# Patient Record
Sex: Male | Born: 1970
Health system: Southern US, Community
[De-identification: ages and names within clinical notes are randomized; demographics above are authoritative.]

## PROBLEM LIST (undated history)

## (undated) DIAGNOSIS — D649 Anemia, unspecified: Secondary | ICD-10-CM

## (undated) DIAGNOSIS — J45909 Unspecified asthma, uncomplicated: Secondary | ICD-10-CM

## (undated) DIAGNOSIS — K759 Inflammatory liver disease, unspecified: Secondary | ICD-10-CM

## (undated) DIAGNOSIS — A630 Anogenital (venereal) warts: Secondary | ICD-10-CM

## (undated) DIAGNOSIS — M1990 Unspecified osteoarthritis, unspecified site: Secondary | ICD-10-CM

## (undated) DIAGNOSIS — E669 Obesity, unspecified: Secondary | ICD-10-CM

## (undated) HISTORY — PX: HIP SURGERY: SHX245

## (undated) HISTORY — DX: Unspecified osteoarthritis, unspecified site: M19.90

## (undated) HISTORY — DX: Anogenital (venereal) warts: A63.0

## (undated) HISTORY — DX: Anemia, unspecified: D64.9

## (undated) HISTORY — DX: Unspecified asthma, uncomplicated: J45.909

---

## 2013-03-01 ENCOUNTER — Emergency Department: Payer: Self-pay | Admitting: Internal Medicine

## 2014-07-21 ENCOUNTER — Other Ambulatory Visit: Payer: Self-pay | Admitting: Family Medicine

## 2014-07-21 DIAGNOSIS — R768 Other specified abnormal immunological findings in serum: Secondary | ICD-10-CM

## 2014-07-30 ENCOUNTER — Ambulatory Visit: Payer: Self-pay

## 2014-08-04 ENCOUNTER — Ambulatory Visit: Payer: BLUE CROSS/BLUE SHIELD

## 2014-08-11 ENCOUNTER — Ambulatory Visit
Admission: RE | Admit: 2014-08-11 | Discharge: 2014-08-11 | Disposition: A | Discharge status: Home / Self Care | Payer: BLUE CROSS/BLUE SHIELD | Source: Ambulatory Visit | Attending: Family Medicine | Admitting: Family Medicine

## 2014-08-11 DIAGNOSIS — R768 Other specified abnormal immunological findings in serum: Secondary | ICD-10-CM

## 2014-08-11 DIAGNOSIS — B1920 Unspecified viral hepatitis C without hepatic coma: Secondary | ICD-10-CM | POA: Diagnosis not present

## 2014-08-11 DIAGNOSIS — K769 Liver disease, unspecified: Secondary | ICD-10-CM | POA: Insufficient documentation

## 2014-08-12 ENCOUNTER — Telehealth: Payer: Self-pay | Admitting: Family Medicine

## 2014-08-12 DIAGNOSIS — B1920 Unspecified viral hepatitis C without hepatic coma: Secondary | ICD-10-CM | POA: Insufficient documentation

## 2014-08-12 DIAGNOSIS — B182 Chronic viral hepatitis C: Secondary | ICD-10-CM

## 2014-08-12 DIAGNOSIS — K769 Liver disease, unspecified: Secondary | ICD-10-CM | POA: Insufficient documentation

## 2014-08-12 NOTE — Telephone Encounter (Signed)
I tried to contact this patient again but was unsuccessful.

## 2014-08-12 NOTE — Telephone Encounter (Signed)
i tried to contact this patient to discuss the information below from Dr. Nadine Counts, but I could not get through. I was not able to leave a message. I will try again later.

## 2014-08-12 NOTE — Telephone Encounter (Signed)
Please let [name] know that his RUQ ultrasound shows a left liver lobe mass that needs further MRI imaging to make sure it is not malignant in light of his chronic Hepatitis C infection. The radiologist says it may just be a collection of benign blood vessels but he can't be sure until we do an MRI. I have ordered the imaging and [facility] should be calling him to schedule the appointment. If he hasn't heard anything in 1-2 weeks please call our office so we can sort it out. Thank you.

## 2014-08-13 NOTE — Telephone Encounter (Signed)
Contacted this patient's emergency contact to see if she can get in contact with this patient for me since I have been unsuccessful in several attempts ans she said that she would.

## 2014-08-13 NOTE — Telephone Encounter (Signed)
I was finally able to get in contact with this patient to discuss the information from Dr. Nadine Counts. Patient was informed that he should get a call regarding his appointment but if he has not heard anything within 2 weeks to give [place] a call back. Patient agreed and said thanks.

## 2014-08-13 NOTE — Telephone Encounter (Signed)
[phone] it is okay for you to leave a detailed message

## 2014-08-18 ENCOUNTER — Encounter: Payer: Self-pay | Admitting: Family Medicine

## 2014-08-18 ENCOUNTER — Ambulatory Visit (INDEPENDENT_AMBULATORY_CARE_PROVIDER_SITE_OTHER): Payer: BLUE CROSS/BLUE SHIELD | Admitting: Family Medicine

## 2014-08-18 VITALS — BP 122/82 | HR 76 | Temp 98.0°F | Resp 18 | Ht 76.0 in | Wt 335.0 lb

## 2014-08-18 DIAGNOSIS — B182 Chronic viral hepatitis C: Secondary | ICD-10-CM | POA: Diagnosis not present

## 2014-08-18 DIAGNOSIS — R894 Abnormal immunological findings in specimens from other organs, systems and tissues: Secondary | ICD-10-CM | POA: Insufficient documentation

## 2014-08-18 DIAGNOSIS — M1710 Unilateral primary osteoarthritis, unspecified knee: Secondary | ICD-10-CM | POA: Insufficient documentation

## 2014-08-18 DIAGNOSIS — M179 Osteoarthritis of knee, unspecified: Secondary | ICD-10-CM | POA: Insufficient documentation

## 2014-08-18 DIAGNOSIS — Z0000 Encounter for general adult medical examination without abnormal findings: Secondary | ICD-10-CM | POA: Diagnosis not present

## 2014-08-18 DIAGNOSIS — Z13220 Encounter for screening for lipoid disorders: Secondary | ICD-10-CM | POA: Insufficient documentation

## 2014-08-18 DIAGNOSIS — B009 Herpesviral infection, unspecified: Secondary | ICD-10-CM | POA: Insufficient documentation

## 2014-08-18 DIAGNOSIS — Z8619 Personal history of other infectious and parasitic diseases: Secondary | ICD-10-CM | POA: Insufficient documentation

## 2014-08-18 DIAGNOSIS — R768 Other specified abnormal immunological findings in serum: Secondary | ICD-10-CM | POA: Insufficient documentation

## 2014-08-18 DIAGNOSIS — D649 Anemia, unspecified: Secondary | ICD-10-CM | POA: Insufficient documentation

## 2014-08-18 DIAGNOSIS — J45909 Unspecified asthma, uncomplicated: Secondary | ICD-10-CM | POA: Insufficient documentation

## 2014-08-18 DIAGNOSIS — E669 Obesity, unspecified: Secondary | ICD-10-CM | POA: Insufficient documentation

## 2014-08-18 LAB — POC HEMOCCULT BLD/STL (OFFICE/1-CARD/DIAGNOSTIC): Card #1 Date: NEGATIVE

## 2014-08-18 NOTE — Progress Notes (Signed)
Name: Seth Simmons   MRN: [id number]    DOB: 05/15/1970   Date:08/18/2014       Progress Note  Subjective  Chief Complaint  Chief Complaint  Patient presents with  . Annual Exam    patient is here for review of his results    HPI  Patient is here today for a Complete Male Physical Exam:  The patient has no acute concerns other than discussing ongoing work up of Hepatitis C. Overall feels healthy. Diet is well balanced. In general does not exercise regularly. Sees dentist regularly and addresses vision concerns with ophthalmologist if applicable. In regards to sexual activity the patient is currently sexually active. Currently is concerned about exposure to any STDs. Has had blood work done recently to address this.  Active Ambulatory Problems    Diagnosis Date Noted  . Hepatitis C virus infection without hepatic coma 08/12/2014  . Liver lesion, left lobe 08/12/2014  . Airway hyperreactivity 08/18/2014  . Absolute anemia 08/18/2014  . HCV antibody positive 08/18/2014  . Herpes simplex type 2 infection 08/18/2014  . H/O varicella 08/18/2014  . Extreme obesity 08/18/2014  . Adiposity 08/18/2014  . Arthritis of knee, degenerative 08/18/2014  . Encounter for screening for lipoid disorders 08/18/2014   Resolved Ambulatory Problems    Diagnosis Date Noted  . No Resolved Ambulatory Problems   Past Medical History  Diagnosis Date  . Anemia   . Arthritis   . Asthma      Past Surgical History  Procedure Laterality Date  . Hip surgery Left     Family History  Problem Relation Age of Onset  . Diabetes Mother   . Diabetes Father   . Diabetes Brother   . Cancer Brother   . Diabetes Brother     History   Social History  . Marital Status: Single    Spouse Name: N/A  . Number of Children: 2  . Years of Education: N/A   Occupational History  . 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 Patient [employer]   Social History Main  Topics  . Smoking status: Never Smoker   . Smokeless tobacco: Not on file  . Alcohol Use: 0.0 oz/week    0 Standard drinks or equivalent per week  . Drug Use: No  . Sexual Activity:    Partners: Female   Other Topics Concern  . Not on file   Social History Narrative  . No narrative on file     Current outpatient prescriptions:  .  diclofenac (VOLTAREN) 75 MG EC tablet, Take by mouth., Disp: , Rfl:   No Known Allergies  ROS  CONSTITUTIONAL: No significant weight changes, fever, chills, weakness or fatigue.  HEENT:  - Eyes: No visual changes.  - Ears: No auditory changes. No pain.  - Nose: No sneezing, congestion, runny nose. - Throat: No sore throat. No changes in swallowing. SKIN: No rash or itching.  CARDIOVASCULAR: No chest pain, chest pressure or chest discomfort. No palpitations or edema.  RESPIRATORY: No shortness of breath, cough or sputum.  GASTROINTESTINAL: No anorexia, nausea, vomiting. No changes in bowel habits. No abdominal pain or blood.  GENITOURINARY: No dysuria. No frequency. No discharge.  NEUROLOGICAL: No headache, dizziness, syncope, paralysis, ataxia, numbness or tingling in the extremities. No memory changes. No change in bowel or bladder control.  MUSCULOSKELETAL: No joint pain. No muscle pain. HEMATOLOGIC: No anemia, bleeding or bruising.  LYMPHATICS: No  enlarged lymph nodes.  PSYCHIATRIC: No change in mood. No change in sleep pattern.  ENDOCRINOLOGIC: No reports of sweating, cold or heat intolerance. No polyuria or polydipsia.   Objective  Filed Vitals:   08/18/14 0838  BP: 122/82  Pulse: 76  Temp: 98 F (36.7 C)  TempSrc: Oral  Resp: 18  Height: 6\' 4"  (1.93 m)  Weight: 335 lb (151.955 kg)  SpO2: 96%    Depression screen PHQ 2/9 08/18/2014  Decreased Interest 0  Down, Depressed, Hopeless 0  PHQ - 2 Score 0     Physical Exam  Constitutional: Patient is obese and well-nourished. In no distress.  HEENT:  - Head: Normocephalic and  atraumatic.  - Ears: Bilateral TMs gray, no erythema or effusion - Nose: Nasal mucosa moist - Mouth/Throat: Oropharynx is clear and moist. No tonsillar hypertrophy or erythema. No post nasal drainage.  - Eyes: Conjunctivae clear, EOM movements normal. PERRLA. No scleral icterus.  Neck: Normal range of motion. Neck supple. No JVD present. No thyromegaly present.  Cardiovascular: Normal rate, regular rhythm and normal heart sounds.  No murmur heard.  Pulmonary/Chest: Effort normal and breath sounds normal. No respiratory distress. Abdominal: Soft. Bowel sounds are normal, no distension. There is no tenderness. No hepatomegaly.  BREAST: Bilateral breast exam normal with no masses, skin changes or nipple discharge. Gynecomastia present. MALE GENITALIA: Bilateral testes descended with no masses, no penile lesions, no penile discharge. PROSTATE: Normal prostate size and consistency. RECTAL: no rectal masses or hemorrhoids Musculoskeletal: Normal range of motion bilateral UE and LE, no joint effusions. Has crepitus bilateral knees. Hallux valgus bilateral 1st toe MCP joints. Peripheral vascular: Bilateral LE no edema. Neurological: CN II-XII grossly intact with no focal deficits. Alert and oriented to person, place, and time. Coordination, balance, strength, speech and gait are normal.  Skin: Skin is warm and dry. No rash noted. No erythema.  Psychiatric: Patient has a normal mood and affect. Behavior is normal in office today. Judgment and thought content normal in office today.    Assessment & Plan 1. Annual physical exam   2. Chronic hepatitis C without hepatic coma MRI date secured.  - Ambulatory referral to Gastroenterology

## 2014-08-18 NOTE — Patient Instructions (Signed)
Hepatitis C  Hepatitis C is a viral infection of the liver. Infection may go undetected for months or years because symptoms may be absent or very mild. Chronic liver disease is the main danger of hepatitis C. This may lead to scarring of the liver (cirrhosis), liver failure, and liver cancer.  CAUSES   Hepatitis C is caused by the hepatitis C virus (HCV). Formerly, hepatitis C infections were most commonly transmitted through blood transfusions. In the early 1990s, routine testing of donated blood for hepatitis C and exclusion of blood that tests positive for HCV began. Now, HCV is most commonly transmitted from person to person through injection drug use, sharing needles, or sex with an infected person. A caregiver may also get the infection from exposure to the blood of an infected patient by way of a cut or needle stick.   SYMPTOMS   Acute Phase  Many cases of acute HCV infection are mild and cause few problems. Some people may not even realize they are sick. Symptoms in others may last a few weeks to several months and include:  · Feeling very tired.  · Loss of appetite.  · Nausea.  · Vomiting.  · Abdominal pain.  · Dark yellow urine.  · Yellow skin and eyes (jaundice).  · Itching of the skin.  Chronic Phase  · Between 50% to 85% of people who get HCV infection become "chronic carriers." They often have no symptoms, but the virus stays in their body. They may spread the virus to others and can get long-term liver disease.  · Many people with chronic HCV infection remain healthy for many years. However, up to 1 in 5 chronically infected people may develop severe liver diseases including scarring of the liver (cirrhosis), liver failure, or liver cancer.  DIAGNOSIS   Diagnosis of hepatitis C infection is made by testing blood for the presence of hepatitis C viral particles called RNA. Other tests may also be done to measure the status of current liver function, exclude other liver problems, or assess liver  damage.  TREATMENT   Treatment with many antiviral drugs is available and recommended for some patients with chronic HCV infection. Drug treatment is generally considered appropriate for patients who:  · Are 18 years of age or older.  · Have a positive test for HCV particles in the blood.  · Have a liver tissue sample (biopsy) that shows chronic hepatitis and significant scarring (fibrosis).  · Do not have signs of liver failure.  · Have acceptable blood test results that confirm the wellness of other body organs.  · Are willing to be treated and conform to treatment requirements.  · Have no other circumstances that would prevent treatment from being recommended (contraindications).  All people who are offered and choose to receive drug treatment must understand that careful medical follow up for many months and even years is crucial in order to make successful care possible. The goal of drug treatment is to eliminate any evidence of HCV in the blood on a long-term basis. This is called a "sustained virologic response" or SVR. Achieving a SVR is associated with a decrease in the chance of life-threatening liver problems, need for a liver transplant, liver cancer rates, and liver-related complications.  Successful treatment currently requires taking treatment drugs for at least 24 weeks and up to 72 weeks. An injected drug (interferon) given weekly and an oral antiviral medicine taken daily are usually prescribed. Side effects from these drugs are common and some may be very   serious. Your response to treatment must be carefully monitored by both you and your caregiver throughout the entire treatment period.  PREVENTION  There is no vaccine for hepatitis C. The only way to prevent the disease is to reduce the risk of exposure to the virus.   · Avoid sharing drug needles or personal items like toothbrushes, razors, and nail clippers with an infected person.  · Healthcare workers need to avoid injuries and wear  appropriate protective equipment such as gloves, gowns, and face masks when performing invasive medical or nursing procedures.  HOME CARE INSTRUCTIONS   To avoid making your liver disease worse:  · Strictly avoid drinking alcohol.  · Carefully review all new prescriptions of medicines with your caregiver. Ask your caregiver which drugs you should avoid. The following drugs are toxic to the liver, and your caregiver may tell you to avoid them:  ¨ Isoniazid.  ¨ Methyldopa.  ¨ Acetaminophen.  ¨ Anabolic steroids (muscle-building drugs).  ¨ Erythromycin.  ¨ Oral contraceptives (birth control pills).  · Check with your caregiver to make sure medicine you are currently taking will not be harmful.  · Periodic blood tests may be required. Follow your caregiver's advice about when you should have blood tests.  · Avoid a sexual relationship until advised otherwise by your caregiver.  · Avoid activities that could expose other people to your blood. Examples include sharing a toothbrush, nail clippers, razors, and needles.  · Bed rest is not necessary, but it may make you feel better. Recovery time is not related to the amount of rest you receive.  · This infection is contagious. Follow your caregiver's instructions in order to avoid spread of the infection.  SEEK IMMEDIATE MEDICAL CARE IF:  · You have increasing fatigue or weakness.  · You have an oral temperature above 102° F (38.9° C), not controlled by medicine.  · You develop loss of appetite, nausea, or vomiting.  · You develop jaundice.  · You develop easy bruising or bleeding.  · You develop any severe problems as a result of your treatment.  MAKE SURE YOU:   · Understand these instructions.  · Will watch your condition.  · Will get help right away if you are not doing well or get worse.  Document Released: 02/10/2000 Document Revised: 05/07/2011 Document Reviewed: 05/27/2013  ExitCare® Patient Information ©2015 [employer]. This information is not intended to replace  advice given to you by your health care provider. Make sure you discuss any questions you have with your health care provider.

## 2014-08-18 NOTE — Addendum Note (Signed)
Addended by: Bobetta Lime on: 08/18/2014 09:39 AM   Modules accepted: Orders, SmartSet

## 2014-08-20 ENCOUNTER — Other Ambulatory Visit: Payer: Self-pay | Admitting: Family Medicine

## 2014-08-20 ENCOUNTER — Telehealth: Payer: Self-pay

## 2014-08-20 DIAGNOSIS — B182 Chronic viral hepatitis C: Secondary | ICD-10-CM

## 2014-08-20 NOTE — Telephone Encounter (Signed)
Called patient, had to leave message, I need him to come by the office and pick up a lab slip and get a CMP drawn before his MRI so we can be sure the contrast won't harm his kidneys otherwise it'll have to the rescheduled.

## 2014-08-20 NOTE — Telephone Encounter (Signed)
Left a message for this patient to come by and pick up a lab slip or give a call with a fax number where we can fax this lab order so he can have his labs drawn prior to his imaging.

## 2014-08-21 ENCOUNTER — Ambulatory Visit
Admission: RE | Admit: 2014-08-21 | Discharge: 2014-08-21 | Disposition: A | Discharge status: Home / Self Care | Payer: BLUE CROSS/BLUE SHIELD | Source: Ambulatory Visit | Attending: Family Medicine | Admitting: Family Medicine

## 2014-08-21 ENCOUNTER — Other Ambulatory Visit
Admission: RE | Admit: 2014-08-21 | Discharge: 2014-08-21 | Disposition: A | Discharge status: Home / Self Care | Payer: BLUE CROSS/BLUE SHIELD | Source: Ambulatory Visit | Attending: Family Medicine | Admitting: Family Medicine

## 2014-08-21 DIAGNOSIS — B182 Chronic viral hepatitis C: Secondary | ICD-10-CM | POA: Diagnosis not present

## 2014-08-21 DIAGNOSIS — K7689 Other specified diseases of liver: Secondary | ICD-10-CM | POA: Diagnosis not present

## 2014-08-21 DIAGNOSIS — K769 Liver disease, unspecified: Secondary | ICD-10-CM

## 2014-08-21 LAB — COMPREHENSIVE METABOLIC PANEL
ALK PHOS: 65 U/L (ref 38–126)
ALT: 61 U/L (ref 17–63)
AST: 46 U/L — AB (ref 15–41)
Albumin: 4 g/dL (ref 3.5–5.0)
Anion gap: 11 (ref 5–15)
BUN: 15 mg/dL (ref 6–20)
CALCIUM: 8.5 mg/dL — AB (ref 8.9–10.3)
CHLORIDE: 107 mmol/L (ref 101–111)
CO2: 22 mmol/L (ref 22–32)
CREATININE: 0.76 mg/dL (ref 0.61–1.24)
GFR calc non Af Amer: >60 mL/min (ref >60)
Glucose, Bld: 96 mg/dL (ref 65–99)
POTASSIUM: 3.6 mmol/L (ref 3.5–5.1)
Sodium: 140 mmol/L (ref 135–145)
Total Bilirubin: 0.2 mg/dL — ABNORMAL LOW (ref 0.3–1.2)
Total Protein: 7.9 g/dL (ref 6.5–8.1)

## 2014-08-21 MED ORDER — GADOBENATE DIMEGLUMINE 529 MG/ML IV SOLN
20.0000 mL | Freq: Once | INTRAVENOUS | Status: AC | PRN
Start: 2014-08-21 — End: 2014-08-21
  Administered 2014-08-21: 20 mL via INTRAVENOUS

## 2014-09-14 ENCOUNTER — Telehealth: Payer: Self-pay | Admitting: Family Medicine

## 2014-09-14 NOTE — Telephone Encounter (Signed)
Patients girlfriend called about a referral to see someone for hepatits C. Pt was here in May or [place] and They have not heard anything.

## 2014-09-14 NOTE — Telephone Encounter (Signed)
Appointment is scheduled on 09/20/14 at 8:30am at [facility], patients girlfriend was notified, if needed they will call and reschedule if it conflicts with his schedule.

## 2014-09-14 NOTE — Telephone Encounter (Signed)
Shelly can you call over to Dr. Marton Redwood office and find out why they have not scheduled this patient with Hepatitis C??

## 2014-09-20 ENCOUNTER — Other Ambulatory Visit: Payer: Self-pay | Admitting: Student

## 2014-09-20 DIAGNOSIS — B182 Chronic viral hepatitis C: Secondary | ICD-10-CM

## 2014-09-21 ENCOUNTER — Other Ambulatory Visit: Payer: Self-pay | Admitting: Family Medicine

## 2014-09-21 ENCOUNTER — Telehealth: Payer: Self-pay | Admitting: Family Medicine

## 2014-09-21 DIAGNOSIS — L253 Unspecified contact dermatitis due to other chemical products: Secondary | ICD-10-CM

## 2014-09-21 MED ORDER — PREDNISONE 10 MG (21) PO TBPK: ORAL_TABLET | ORAL | Status: DC

## 2014-09-21 NOTE — Telephone Encounter (Signed)
Prednisone pack sent to [facility] [address].

## 2014-09-21 NOTE — Telephone Encounter (Signed)
Pt informed about meds being called in.

## 2014-09-21 NOTE — Telephone Encounter (Signed)
Patient is having an allergic reaction to the dye that he put in his beard. States this has happened before (last yr) and the ER prescribed him steroid pills that he had to take for a few days. He does not remember the name of the pill but says maybe [facility] would know.

## 2014-09-22 ENCOUNTER — Telehealth: Payer: Self-pay | Admitting: Family Medicine

## 2014-09-22 ENCOUNTER — Ambulatory Visit
Admission: RE | Admit: 2014-09-22 | Discharge: 2014-09-22 | Disposition: A | Discharge status: Home / Self Care | Payer: BLUE CROSS/BLUE SHIELD | Source: Ambulatory Visit | Attending: Student | Admitting: Student

## 2014-09-22 DIAGNOSIS — B182 Chronic viral hepatitis C: Secondary | ICD-10-CM | POA: Insufficient documentation

## 2014-09-22 NOTE — Telephone Encounter (Signed)
Tried to contact patient to let him know that a rx was submitted but that no work note was written due to him not being seen here in the office, but there was no answer.   A message was left for the patient stating this and to give [place] a call if he had any questions.

## 2014-09-22 NOTE — Telephone Encounter (Signed)
Face broke out due to allergic reaction. Was going to try to return to work on tomorrow however he is needing one for 09-21-14 and 09-22-14.

## 2014-09-22 NOTE — Telephone Encounter (Signed)
I sent in prednisone as a courtesy without seeing him for his rash but I can not provide work note excuse without documented office visit.

## 2014-09-23 ENCOUNTER — Ambulatory Visit: Payer: BLUE CROSS/BLUE SHIELD

## 2014-10-20 ENCOUNTER — Encounter: Payer: Self-pay | Admitting: *Deleted

## 2014-10-21 ENCOUNTER — Ambulatory Visit: Payer: BLUE CROSS/BLUE SHIELD | Admitting: Anesthesiology

## 2014-10-21 ENCOUNTER — Encounter: Payer: Self-pay | Admitting: Anesthesiology

## 2014-10-21 ENCOUNTER — Encounter
Admission: RE | Disposition: A | Discharge status: Home / Self Care | Payer: Self-pay | Source: Ambulatory Visit | Attending: Gastroenterology

## 2014-10-21 ENCOUNTER — Ambulatory Visit
Admission: RE | Admit: 2014-10-21 | Discharge: 2014-10-21 | Disposition: A | Discharge status: Home / Self Care | Payer: BLUE CROSS/BLUE SHIELD | Source: Ambulatory Visit | Attending: Gastroenterology | Admitting: Gastroenterology

## 2014-10-21 DIAGNOSIS — Z6839 Body mass index (BMI) 39.0-39.9, adult: Secondary | ICD-10-CM | POA: Insufficient documentation

## 2014-10-21 DIAGNOSIS — D649 Anemia, unspecified: Secondary | ICD-10-CM | POA: Insufficient documentation

## 2014-10-21 DIAGNOSIS — B1920 Unspecified viral hepatitis C without hepatic coma: Secondary | ICD-10-CM | POA: Insufficient documentation

## 2014-10-21 DIAGNOSIS — J45909 Unspecified asthma, uncomplicated: Secondary | ICD-10-CM | POA: Diagnosis not present

## 2014-10-21 DIAGNOSIS — K317 Polyp of stomach and duodenum: Secondary | ICD-10-CM | POA: Diagnosis not present

## 2014-10-21 DIAGNOSIS — K7460 Unspecified cirrhosis of liver: Secondary | ICD-10-CM | POA: Insufficient documentation

## 2014-10-21 DIAGNOSIS — M1990 Unspecified osteoarthritis, unspecified site: Secondary | ICD-10-CM | POA: Diagnosis not present

## 2014-10-21 HISTORY — DX: Obesity, unspecified: E66.9

## 2014-10-21 HISTORY — PX: ESOPHAGOGASTRODUODENOSCOPY (EGD) WITH PROPOFOL: SHX5813

## 2014-10-21 HISTORY — DX: Inflammatory liver disease, unspecified: K75.9

## 2014-10-21 SURGERY — ESOPHAGOGASTRODUODENOSCOPY (EGD) WITH PROPOFOL
Anesthesia: General

## 2014-10-21 MED ORDER — PROPOFOL INFUSION 10 MG/ML OPTIME
INTRAVENOUS | Status: DC | PRN
  Administered 2014-10-21: 300 ug/kg/min via INTRAVENOUS

## 2014-10-21 MED ORDER — PROPOFOL 10 MG/ML IV BOLUS
INTRAVENOUS | Status: DC | PRN
  Administered 2014-10-21: 80 mg via INTRAVENOUS
  Administered 2014-10-21: 20 mg via INTRAVENOUS
  Administered 2014-10-21: 50 mg via INTRAVENOUS
  Administered 2014-10-21: 80 mg via INTRAVENOUS

## 2014-10-21 MED ORDER — SODIUM CHLORIDE 0.9 % IV SOLN
INTRAVENOUS | Status: DC
  Administered 2014-10-21: 1000 mL via INTRAVENOUS

## 2014-10-21 MED ORDER — SODIUM CHLORIDE 0.9 % IV SOLN: INTRAVENOUS | Status: DC

## 2014-10-21 MED ORDER — GLYCOPYRROLATE 0.2 MG/ML IJ SOLN
INTRAMUSCULAR | Status: DC | PRN
  Administered 2014-10-21: 0.3 mg via INTRAVENOUS

## 2014-10-21 MED ORDER — MIDAZOLAM HCL 2 MG/2ML IJ SOLN
INTRAMUSCULAR | Status: DC | PRN
  Administered 2014-10-21 (×2): 2 mg via INTRAVENOUS

## 2014-10-21 MED ORDER — LIDOCAINE HCL (CARDIAC) 20 MG/ML IV SOLN
INTRAVENOUS | Status: DC | PRN
  Administered 2014-10-21: 100 mg via INTRAVENOUS

## 2014-10-21 MED ORDER — FENTANYL CITRATE (PF) 100 MCG/2ML IJ SOLN
INTRAMUSCULAR | Status: DC | PRN
  Administered 2014-10-21 (×2): 50 ug via INTRAVENOUS

## 2014-10-21 NOTE — Op Note (Signed)
[facility]astroenterology Patient Name: Seth Simmons Procedure Date: 10/21/2014 10:39 AM MRN: [id number] Account #: [id number] Date of Birth: 12-06-1970 Admit Type: Outpatient Age: 44 Room: [facility] ENDO ROOM 1 Gender: Male Note Status: Finalized Procedure:         Upper GI endoscopy Indications:       Cirrhosis rule out esophageal varices Patient Profile:   This is a 44 year old male. Providers:         Gerrit Heck. Rayann Heman, MD Referring MD:      Bobetta Lime (Referring MD) Medicines:         Propofol per Anesthesia Complications:     No immediate complications. Procedure:         Pre-Anesthesia Assessment:                    - Prior to the procedure, a History and Physical was                     performed, and patient medications, allergies and                     sensitivities were reviewed. The patient's tolerance of                     previous anesthesia was reviewed.                    After obtaining informed consent, the endoscope was passed                     under direct vision. Throughout the procedure, the                     patient's blood pressure, pulse, and oxygen saturations                     were monitored continuously. The Endoscope was introduced                     through the mouth, and advanced to the second part of                     duodenum. The upper GI endoscopy was accomplished without                     difficulty. The patient tolerated the procedure well. Findings:      The esophagus was normal. No esophageal varices.      A single 10 mm sessile polyp with no bleeding was found in the gastric       antrum. The polyp was removed with a saline injection-lift technique       using a hot snare. Resection and retrieval were complete. Two hemostatic       clips were successfully placed. This was done to prevent bleeding.       Estimated blood loss: none.      The examined duodenum was normal.      The exam was otherwise without  abnormality. Impression:        - Normal esophagus.                    - A single gastric polyp. Resected and retrieved. Clips                     were  placed.                    - Normal examined duodenum.                    - The examination was otherwise normal. Recommendation:    - Observe patient in GI recovery unit.                    - Resume regular diet.                    - Continue present medications.                    - Await pathology results.                    - Return to GI clinic.                    - The findings and recommendations were discussed with the                     patient.                    - The findings and recommendations were discussed with the                     patient's family. Procedure Code(s): --- Professional ---                    [phone], Esophagogastroduodenoscopy, flexible, transoral;                     with removal of tumor(s), polyp(s), or other lesion(s) by                     snare technique                    [id number], Esophagogastroduodenoscopy, flexible, transoral;                     with directed submucosal injection(s), any substance CPT copyright 2014 American Medical Association. All rights reserved. The codes documented in this report are preliminary and upon coder review may  be revised to meet current compliance requirements. Mellody Life, MD 10/21/2014 11:22:59 AM This report has been signed electronically. Number of Addenda: 0 Note Initiated On: 10/21/2014 10:39 AM      [facility]

## 2014-10-21 NOTE — Anesthesia Preprocedure Evaluation (Addendum)
Anesthesia Evaluation  Patient identified by MRN, date of birth, ID band Patient awake    Reviewed: Allergy & Precautions, NPO status , Patient's Chart, lab work & pertinent test results, reviewed documented beta blocker date and time   Airway Mallampati: II  TM Distance: >3 FB     Dental  (+) Chipped   Pulmonary asthma ,          Cardiovascular     Neuro/Psych    GI/Hepatic (+) Hepatitis -, C  Endo/Other  Morbid obesity  Renal/GU      Musculoskeletal  (+) Arthritis -,   Abdominal   Peds  Hematology  (+) anemia ,   Anesthesia Other Findings Obesity.  Reproductive/Obstetrics                            Anesthesia Physical Anesthesia Plan  ASA: III  Anesthesia Plan: General   Post-op Pain Management:    Induction:   Airway Management Planned: Nasal Cannula  Additional Equipment:   Intra-op Plan:   Post-operative Plan:   Informed Consent: I have reviewed the patients History and Physical, chart, labs and discussed the procedure including the risks, benefits and alternatives for the proposed anesthesia with the patient or authorized representative who has indicated his/her understanding and acceptance.     Plan Discussed with: CRNA  Anesthesia Plan Comments:         Anesthesia Quick Evaluation

## 2014-10-21 NOTE — Discharge Instructions (Signed)

## 2014-10-21 NOTE — Transfer of Care (Signed)
Immediate Anesthesia Transfer of Care Note  Patient: Seth Simmons  Procedure(s) Performed: Procedure(s): ESOPHAGOGASTRODUODENOSCOPY (EGD) WITH PROPOFOL (N/A)  Patient Location: PACU  Anesthesia Type:General  Level of Consciousness: awake, alert  and oriented  Airway & Oxygen Therapy: Patient Spontanous Breathing  Post-op Assessment: Report given to RN and Post -op Vital signs reviewed and stable  Post vital signs: Reviewed and stable  Last Vitals:  Filed Vitals:   10/21/14 1126  BP: 139/83  Pulse: 108  Temp: 35.7 C  Resp: 15    Complications: No apparent anesthesia complications

## 2014-10-21 NOTE — H&P (Signed)
  Primary Care Physician:  Bobetta Lime, MD  Pre-Procedure History & Physical: HPI:  Seth Simmons is a 44 y.o. male is here for an endoscopy.   Past Medical History  Diagnosis Date  . Anemia   . Arthritis   . Asthma   . Hepatitis   . Obesity     Past Surgical History  Procedure Laterality Date  . Hip surgery Left     Prior to Admission medications   Medication Sig Start Date End Date Taking? Authorizing Provider  diclofenac (VOLTAREN) 75 MG EC tablet Take by mouth.    Historical Provider, MD  predniSONE (STERAPRED UNI-PAK 21 TAB) 10 MG (21) TBPK tablet Use as directed in a 6 day taper PredPak Patient not taking: Reported on 10/20/2014 09/21/14   Bobetta Lime, MD  valACYclovir (VALTREX) 500 MG tablet Take 500 mg by mouth 2 (two) times daily.    Historical Provider, MD    Allergies as of 09/30/2014  . (No Known Allergies)    Family History  Problem Relation Age of Onset  . Diabetes Mother   . Diabetes Father   . Diabetes Brother   . Cancer Brother   . Diabetes Brother     Social History   Social History  . Marital Status: Single    Spouse Name: N/A  . Number of Children: 2  . Years of Education: N/A   Occupational History  . 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 Patient [employer]   Social History Main Topics  . Smoking status: Never Smoker   . Smokeless tobacco: Not on file  . Alcohol Use: 0.0 oz/week    0 Standard drinks or equivalent per week  . Drug Use: No  . Sexual Activity:    Partners: Female   Other Topics Concern  . Not on file   Social History Narrative     Physical Exam: BP 129/87 mmHg  Pulse 75  Temp(Src) 98.1 F (36.7 C) (Tympanic)  Resp 16  Ht 6\' 4"  (1.93 m)  Wt 148.326 kg (327 lb)  BMI 39.82 kg/m2  SpO2 99% General:   Alert,  pleasant and cooperative in NAD Head:  Normocephalic and atraumatic. Neck:  Supple; no masses or thyromegaly. Lungs:  Clear throughout to auscultation.     Heart:  Regular rate and rhythm. Abdomen:  Soft, nontender and nondistended. Normal bowel sounds, without guarding, and without rebound.  + abd adiposity Neurologic:  Alert and  oriented x4;  grossly normal neurologically.  Impression/Plan: Seth Simmons is here for an endoscopy to be performed for cirrhosis, r/o varices  Risks, benefits, limitations, and alternatives regarding  endoscopy have been reviewed with the patient.  Questions have been answered.  All parties agreeable.   Josefine Class, MD  10/21/2014, 10:47 AM

## 2014-10-22 LAB — SURGICAL PATHOLOGY

## 2014-10-22 NOTE — Anesthesia Postprocedure Evaluation (Signed)
  Anesthesia Post-op Note  Patient: Seth Simmons  Procedure(s) Performed: Procedure(s): ESOPHAGOGASTRODUODENOSCOPY (EGD) WITH PROPOFOL (N/A)  Anesthesia type:General  Patient location: PACU  Post pain: Pain level controlled  Post assessment: Post-op Vital signs reviewed, Patient's Cardiovascular Status Stable, Respiratory Function Stable, Patent Airway and No signs of Nausea or vomiting  Post vital signs: Reviewed and stable  Last Vitals:  Filed Vitals:   10/21/14 1158  BP: 113/65  Pulse: 68  Temp:   Resp: 12    Level of consciousness: awake, alert  and patient cooperative  Complications: No apparent anesthesia complications

## 2015-03-02 ENCOUNTER — Ambulatory Visit: Payer: BLUE CROSS/BLUE SHIELD | Admitting: Family Medicine

## 2015-03-16 ENCOUNTER — Ambulatory Visit: Payer: BLUE CROSS/BLUE SHIELD | Admitting: Family Medicine

## 2015-03-23 ENCOUNTER — Ambulatory Visit: Payer: BLUE CROSS/BLUE SHIELD | Admitting: Family Medicine

## 2015-03-30 ENCOUNTER — Ambulatory Visit: Payer: BLUE CROSS/BLUE SHIELD | Admitting: Family Medicine

## 2015-04-13 ENCOUNTER — Ambulatory Visit: Payer: BLUE CROSS/BLUE SHIELD | Admitting: Family Medicine

## 2015-05-25 ENCOUNTER — Other Ambulatory Visit: Payer: Self-pay | Admitting: Gastroenterology

## 2015-05-25 DIAGNOSIS — B182 Chronic viral hepatitis C: Secondary | ICD-10-CM

## 2015-05-27 ENCOUNTER — Ambulatory Visit: Payer: BLUE CROSS/BLUE SHIELD

## 2015-06-01 ENCOUNTER — Ambulatory Visit
Admission: RE | Admit: 2015-06-01 | Discharge: 2015-06-01 | Disposition: A | Discharge status: Home / Self Care | Payer: BLUE CROSS/BLUE SHIELD | Source: Ambulatory Visit | Attending: Gastroenterology | Admitting: Gastroenterology

## 2015-06-01 DIAGNOSIS — B182 Chronic viral hepatitis C: Secondary | ICD-10-CM

## 2015-06-01 DIAGNOSIS — D1803 Hemangioma of intra-abdominal structures: Secondary | ICD-10-CM | POA: Diagnosis not present

## 2016-02-17 DIAGNOSIS — B182 Chronic viral hepatitis C: Secondary | ICD-10-CM | POA: Diagnosis not present

## 2017-03-20 ENCOUNTER — Ambulatory Visit: Payer: BLUE CROSS/BLUE SHIELD | Admitting: Internal Medicine

## 2017-05-06 IMAGING — US US ABDOMEN LIMITED
1 series · 14 of 25 positions shown · non-contrast
Comparison: None.

CLINICAL DATA: Hepatitis-C

EXAM:
US ABDOMEN LIMITED - RIGHT UPPER QUADRANT

[Series 1: us abdomen limited · 0.35mm/px · 14 of 59 slices shown]
[im 1/59]
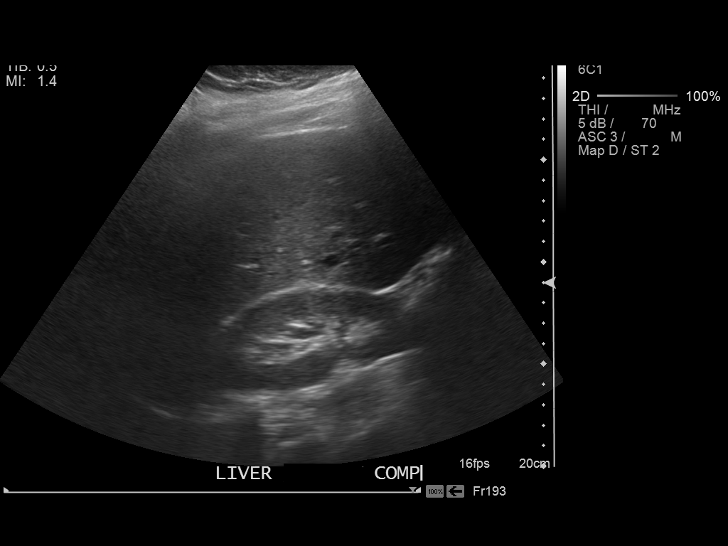
[im 5/59]
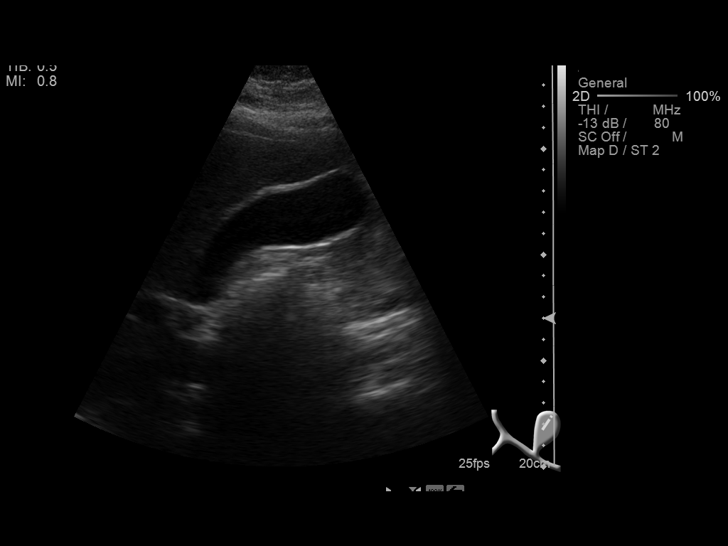
[im 10/59]
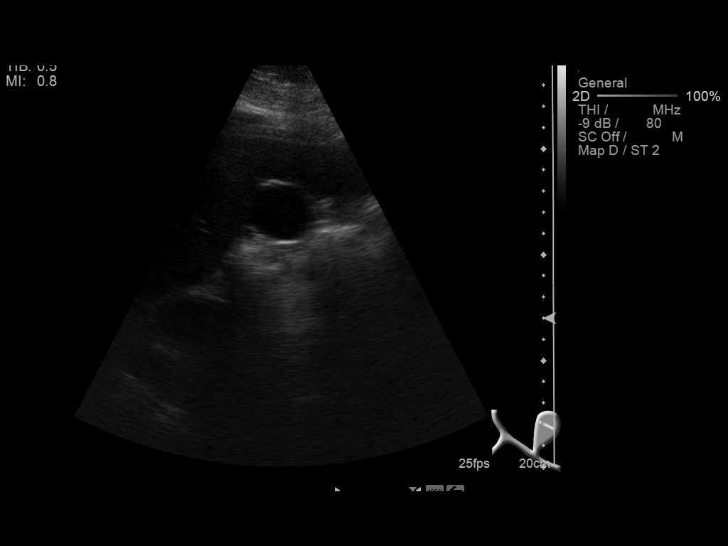
[im 15/59]
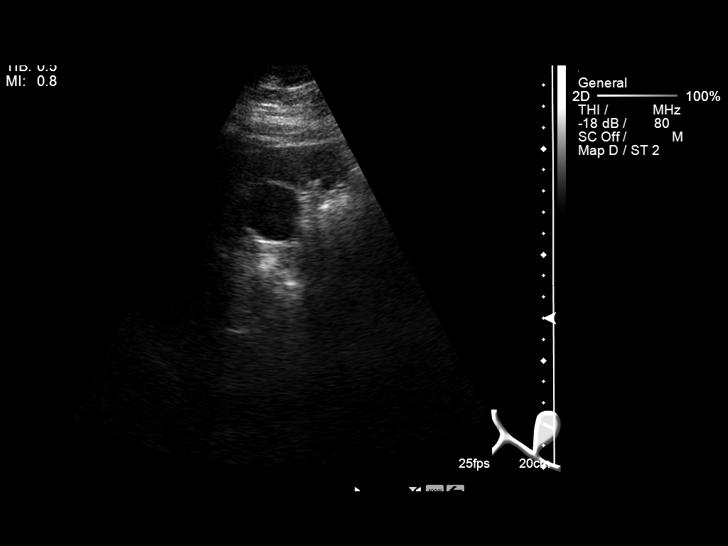
[im 20/59]
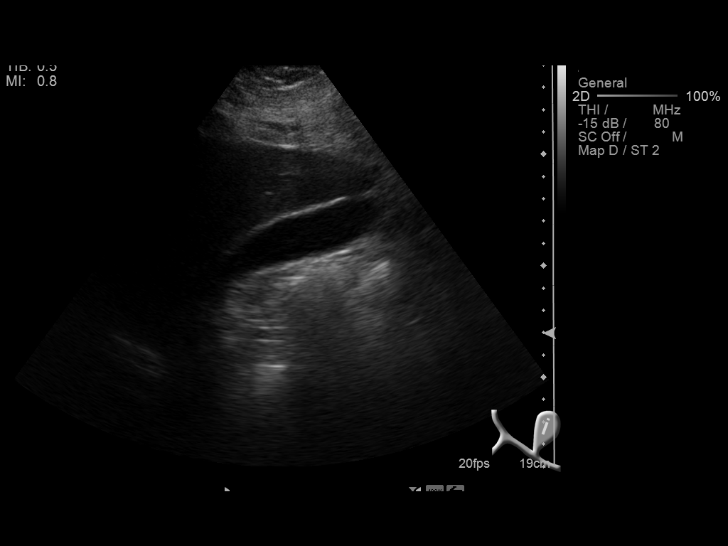
[im 22/59]
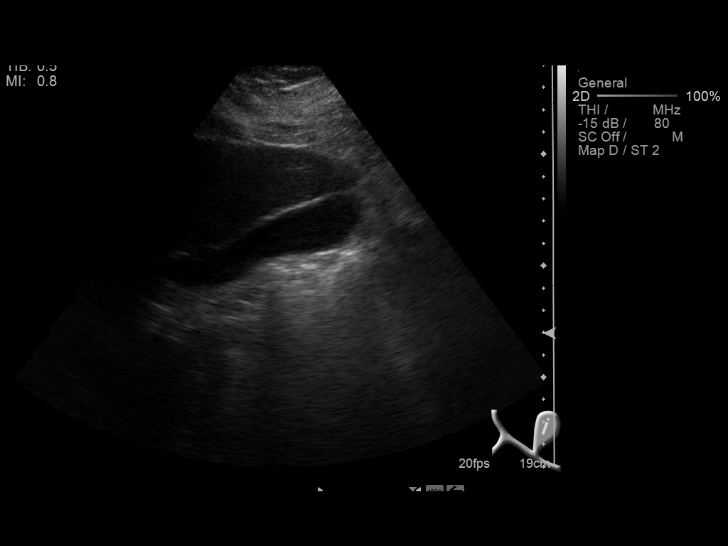
[im 27/59]
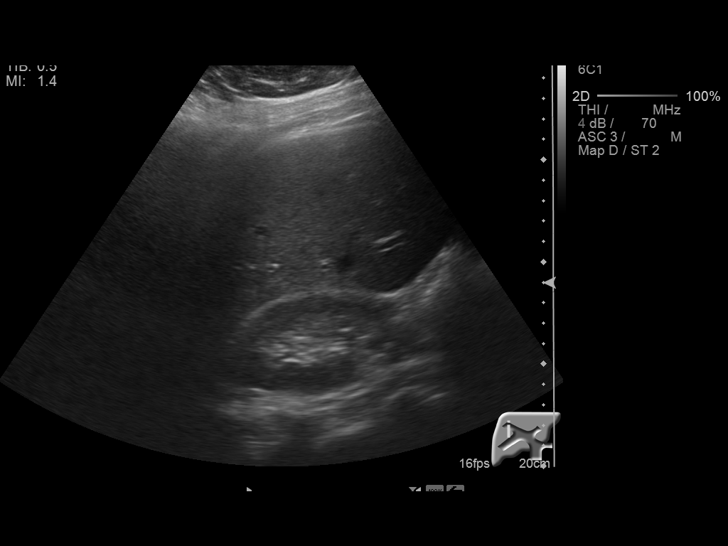
[im 32/59]
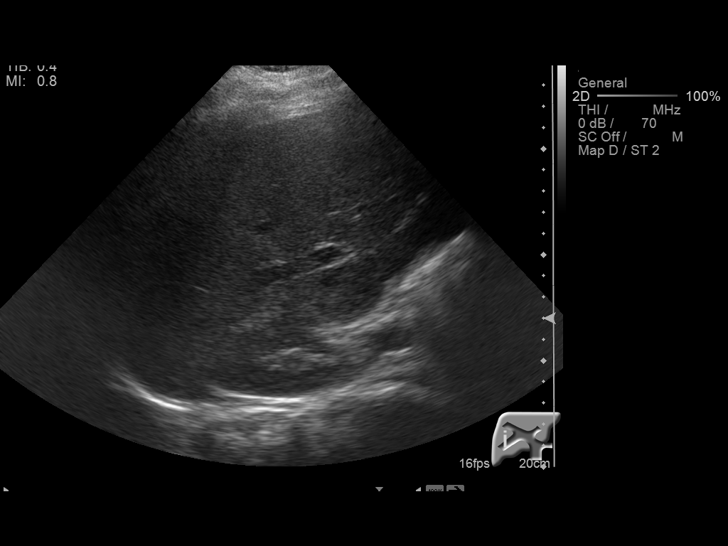
[im 37/59]
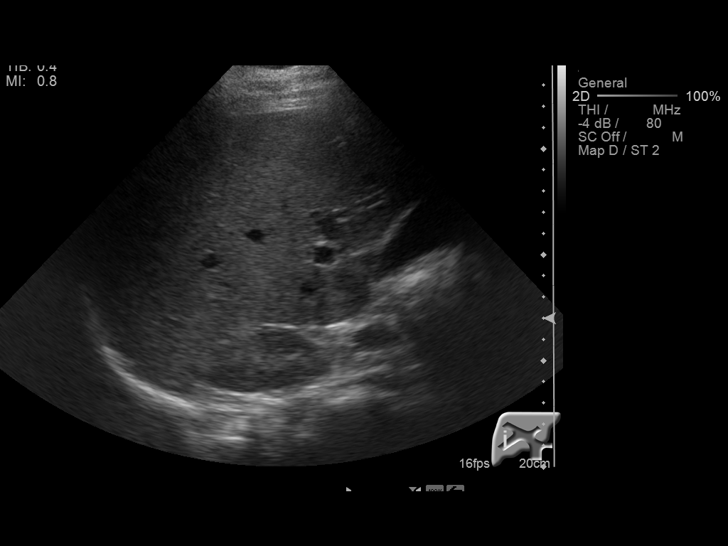
[im 39/59]
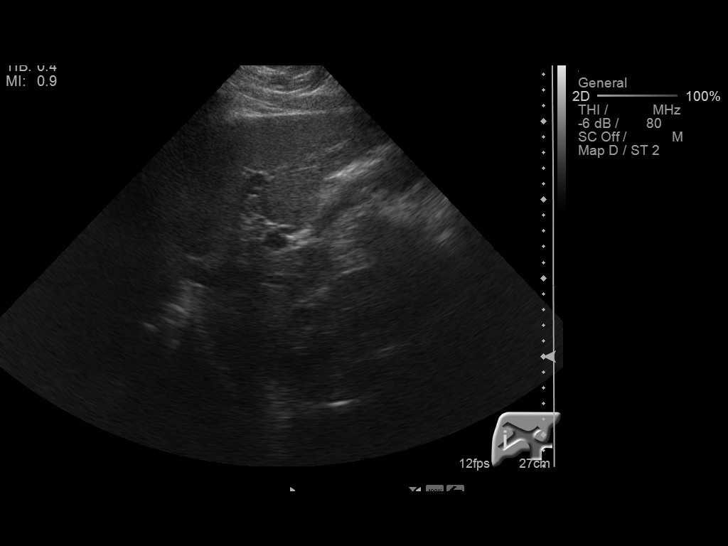
[im 44/59]
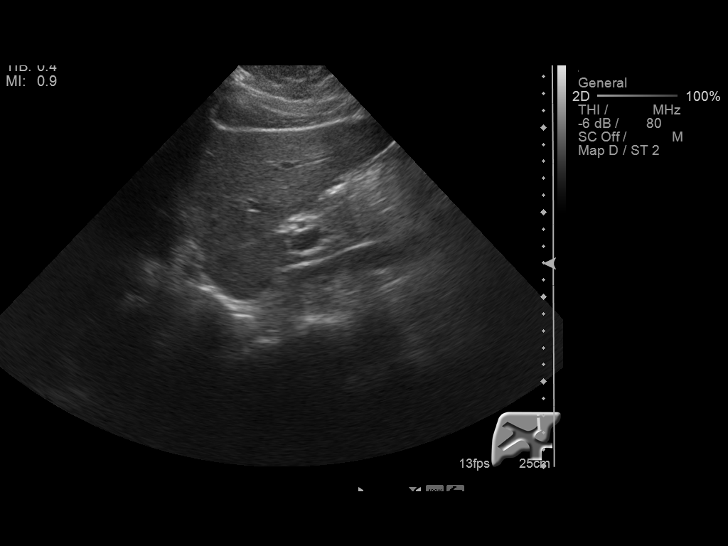
[im 49/59]
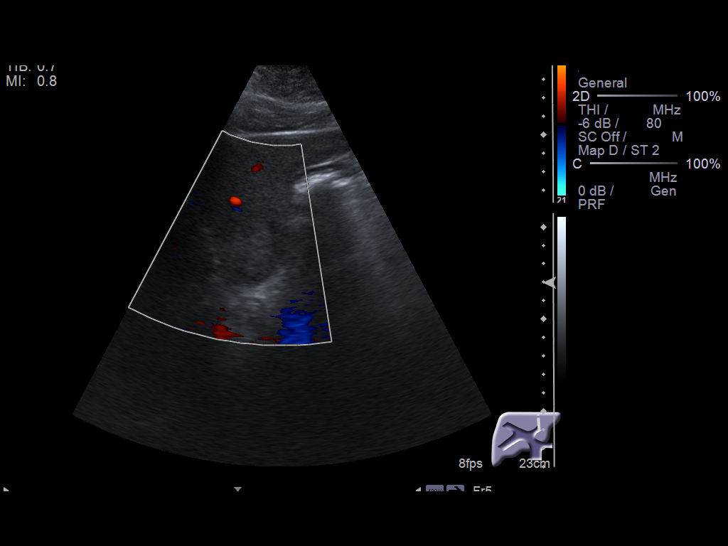
[im 54/59]
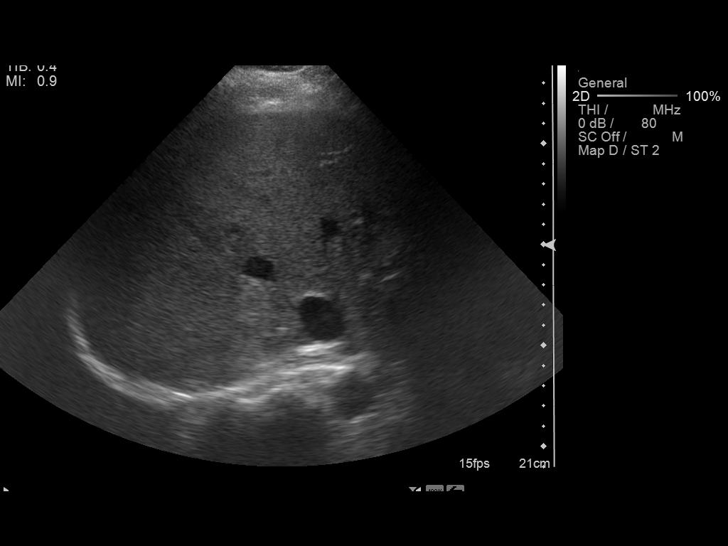
[im 59/59]
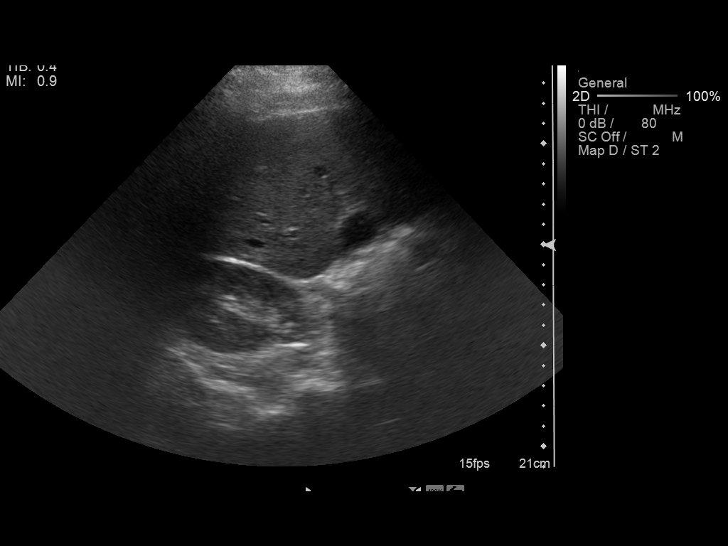

[14 of 25 positions shown; findings below may reference images not displayed]

FINDINGS: Gallbladder:

The gallbladder is well seen and no gallstones are noted. There is
no pain over the gallbladder with compression.

Common bile duct:

Diameter: The common bile duct is normal measuring 4.0 mm.

Liver:

The liver has a relatively normal echogenic pattern. However, within
the posterior left lobe there is a focal echogenic mass of 3.3 x
x 3.0 cm. Although this may represent a hemangioma, in view of the
patient's history, hepatocellular carcinoma would be difficult to
exclude and MR imaging is recommended.
IMPRESSION: 1. 3.3 x 2.9 x 3.0 cm echogenic lesion the left lobe of liver.
Cannot exclude hepatocellular carcinoma although this may represent
a hepatic hemangioma. Recommend MRI of the liver to assess further
in view of the patient's history.
2. No gallstones.

## 2017-06-24 DIAGNOSIS — R0981 Nasal congestion: Secondary | ICD-10-CM | POA: Diagnosis not present

## 2017-06-24 DIAGNOSIS — J40 Bronchitis, not specified as acute or chronic: Secondary | ICD-10-CM | POA: Diagnosis not present

## 2017-10-11 ENCOUNTER — Encounter: Payer: BLUE CROSS/BLUE SHIELD | Admitting: Family

## 2017-10-11 ENCOUNTER — Ambulatory Visit: Payer: BLUE CROSS/BLUE SHIELD | Admitting: Family

## 2017-10-11 ENCOUNTER — Encounter: Payer: Self-pay | Admitting: Family

## 2017-10-11 VITALS — BP 110/70 | HR 70 | Temp 97.8°F | Resp 16 | Ht 75.0 in | Wt 351.5 lb

## 2017-10-11 DIAGNOSIS — B182 Chronic viral hepatitis C: Secondary | ICD-10-CM

## 2017-10-11 DIAGNOSIS — R002 Palpitations: Secondary | ICD-10-CM | POA: Insufficient documentation

## 2017-10-11 DIAGNOSIS — E669 Obesity, unspecified: Secondary | ICD-10-CM

## 2017-10-11 DIAGNOSIS — Z125 Encounter for screening for malignant neoplasm of prostate: Secondary | ICD-10-CM | POA: Diagnosis not present

## 2017-10-11 DIAGNOSIS — Z0000 Encounter for general adult medical examination without abnormal findings: Secondary | ICD-10-CM | POA: Insufficient documentation

## 2017-10-11 LAB — LIPID PANEL
CHOL/HDL RATIO: 3
Cholesterol: 186 mg/dL (ref 0–200)
HDL: 56 mg/dL (ref >39.00)
LDL CALC: 111 mg/dL — AB (ref 0–99)
NonHDL: 130.16
TRIGLYCERIDES: 96 mg/dL (ref 0.0–149.0)
VLDL: 19.2 mg/dL (ref 0.0–40.0)

## 2017-10-11 LAB — CBC WITH DIFFERENTIAL/PLATELET
BASOS ABS: 0.1 10*3/uL (ref 0.0–0.1)
Basophils Relative: 1.2 % (ref 0.0–3.0)
EOS ABS: 0.1 10*3/uL (ref 0.0–0.7)
Eosinophils Relative: 2.2 % (ref 0.0–5.0)
HCT: 42 % (ref 39.0–52.0)
Hemoglobin: 13.7 g/dL (ref 13.0–17.0)
LYMPHS ABS: 1.6 10*3/uL (ref 0.7–4.0)
Lymphocytes Relative: 36.7 % (ref 12.0–46.0)
MCHC: 32.6 g/dL (ref 30.0–36.0)
MCV: 85 fl (ref 78.0–100.0)
MONO ABS: 0.4 10*3/uL (ref 0.1–1.0)
Monocytes Relative: 9.5 % (ref 3.0–12.0)
NEUTROS ABS: 2.2 10*3/uL (ref 1.4–7.7)
NEUTROS PCT: 50.4 % (ref 43.0–77.0)
PLATELETS: 174 10*3/uL (ref 150.0–400.0)
RBC: 4.95 Mil/uL (ref 4.22–5.81)
RDW: 14.1 % (ref 11.5–15.5)
WBC: 4.4 10*3/uL (ref 4.0–10.5)

## 2017-10-11 LAB — COMPREHENSIVE METABOLIC PANEL
ALT: 12 U/L (ref 0–53)
AST: 15 U/L (ref 0–37)
Albumin: 4.1 g/dL (ref 3.5–5.2)
Alkaline Phosphatase: 68 U/L (ref 39–117)
BILIRUBIN TOTAL: 0.5 mg/dL (ref 0.2–1.2)
BUN: 17 mg/dL (ref 6–23)
CHLORIDE: 106 meq/L (ref 96–112)
CO2: 30 meq/L (ref 19–32)
Calcium: 9.2 mg/dL (ref 8.4–10.5)
Creatinine, Ser: 0.84 mg/dL (ref 0.40–1.50)
GFR: 126.51 mL/min (ref >60.00)
GLUCOSE: 99 mg/dL (ref 70–99)
Potassium: 4.1 mEq/L (ref 3.5–5.1)
Sodium: 142 mEq/L (ref 135–145)
Total Protein: 7.5 g/dL (ref 6.0–8.3)

## 2017-10-11 LAB — HEMOGLOBIN A1C: Hgb A1c MFr Bld: 5.8 % (ref 4.6–6.5)

## 2017-10-11 LAB — TSH: TSH: 1.34 u[IU]/mL (ref 0.35–4.50)

## 2017-10-11 LAB — VITAMIN D 25 HYDROXY (VIT D DEFICIENCY, FRACTURES): VITD: 16.22 ng/mL — AB (ref 30.00–100.00)

## 2017-10-11 NOTE — Assessment & Plan Note (Signed)
Lost to follow-up.  Pending referral to GI to reestablish care.  Pending hepatitis C labs.

## 2017-10-11 NOTE — Progress Notes (Signed)
Subjective:    Patient ID: Jordy Verba, male    DOB: [birth year], 47 y.o.   MRN: [id number]  CC: Cristiano Capri is a 47 y.o. male who presents today to establish care.   HPI: H/o of chronic hepatitis c- states cured with Harvoni. Hasnt seen a GI provider.  No n, v, abdominal pain.   Concerned with weight gain. Works two jobs and no time for exercise.   Describes for 'years' get a 'heart race.' No CP. Goes away in a couple minutes. Every episode occurs at work, when moving at work, 1-2 per month. Little dizzy when it occurs. Notes when has grabbed on to side of truck, has felt dizzy. Also if he turns his head to quickly, will feel dizzy.  No syncope, vertigo, sinus congestion, tinnitus, hearing changes. Not drinking a lot of water. Drinking sodas more lately. No headache, vision changes.   Drinks on Saturdays and Sunday. 15 beers total  And 8 ounces of liquor.  Does feel the needs to cut down on drinking alcohol. Doesn't even think about alcohol  People do not feel annoyed by alcohol use. Denies feeling guilty for alcohol consumption or having an eye opener.  NO drug use. Cigars on occasion.   No depression, anxiety. No si/hi.   Never been to cardiology in the past. No known family h/o SCD.   Asthma as child. Has 'outgrown it'.     Dr Rayann Heman 2016- no follow up; due for EGD 2019.EGD in 2016 .     HISTORY:  Past Medical History:  Diagnosis Date  . Anemia   . Arthritis   . Asthma   . Genital warts   . Hepatitis   . Obesity     Past Surgical History:  Procedure Laterality Date  . ESOPHAGOGASTRODUODENOSCOPY (EGD) WITH PROPOFOL N/A 10/21/2014   Procedure: ESOPHAGOGASTRODUODENOSCOPY (EGD) WITH PROPOFOL;  Surgeon: Josefine Class, MD;  Location: [facility] ENDOSCOPY;  Service: Endoscopy;  Laterality: N/A;  . HIP SURGERY Left    Family History  Problem Relation Age of Onset  . Diabetes Mother   . Diabetes Father   . Diabetes Brother   . Cancer Brother   . Diabetes Brother        ALLERGIES: Patient has no known allergies.  Current Outpatient Medications on File Prior to Visit  Medication Sig Dispense Refill  . diclofenac (VOLTAREN) 75 MG EC tablet Take by mouth.     No current facility-administered medications on file prior to visit.     Social History   Tobacco Use  . Smoking status: Light Tobacco Smoker    Types: Cigars  Substance Use Topics  . Alcohol use: Yes    Alcohol/week: 0.0 standard drinks  . Drug use: No    Review of Systems  Constitutional: Negative for chills and fever.  HENT: Negative for congestion, tinnitus, trouble swallowing and voice change.   Respiratory: Negative for cough, shortness of breath and wheezing.   Cardiovascular: Positive for palpitations. Negative for chest pain and leg swelling.  Gastrointestinal: Negative for abdominal distention, abdominal pain, nausea and vomiting.  Neurological: Positive for dizziness. Negative for syncope.      Objective:    BP 110/70 (BP Location: Left Arm, Patient Position: Sitting, Cuff Size: Large)   Pulse 70   Temp 97.8 F (36.6 C) (Oral)   Resp 16   Ht 6\' 3"  (1.905 m)   Wt (!) 351 lb 8 oz (159.4 kg)   SpO2 96%   BMI 43.93 kg/m  BP Readings from Last 3 Encounters:  10/11/17 110/70  10/21/14 113/65  08/18/14 122/82   Wt Readings from Last 3 Encounters:  10/11/17 (!) 351 lb 8 oz (159.4 kg)  10/21/14 (!) 327 lb (148.3 kg)  08/21/14 (!) 339 lb (153.8 kg)    Physical Exam  Constitutional: Vital signs are normal. He appears well-developed and well-nourished.  HENT:  Head: Normocephalic and atraumatic.  Right Ear: Hearing, tympanic membrane, external ear and ear canal normal. No drainage, swelling or tenderness. Tympanic membrane is not injected, not erythematous and not bulging. No middle ear effusion. No decreased hearing is noted.  Left Ear: Hearing, tympanic membrane, external ear and ear canal normal. No drainage, swelling or tenderness. Tympanic membrane is not  injected, not erythematous and not bulging.  No middle ear effusion. No decreased hearing is noted.  Nose: Nose normal. Right sinus exhibits no maxillary sinus tenderness and no frontal sinus tenderness. Left sinus exhibits no maxillary sinus tenderness and no frontal sinus tenderness.  Mouth/Throat: Uvula is midline, oropharynx is clear and moist and mucous membranes are normal. No oropharyngeal exudate, posterior oropharyngeal edema, posterior oropharyngeal erythema or tonsillar abscesses.  Eyes: Pupils are equal, round, and reactive to light. Conjunctivae, EOM and lids are normal. Lids are everted and swept, no foreign bodies found.  Normal fundus bilaterally.  Neck: No thyroid mass and no thyromegaly present.  Cardiovascular: Regular rhythm and normal heart sounds.  Pulmonary/Chest: Effort normal and breath sounds normal. No respiratory distress. He has no wheezes. He has no rhonchi. He has no rales.  Lymphadenopathy:       Head (right side): No submental, no submandibular, no tonsillar, no preauricular, no posterior auricular and no occipital adenopathy present.       Head (left side): No submental, no submandibular, no tonsillar, no preauricular, no posterior auricular and no occipital adenopathy present.    He has no cervical adenopathy.  Neurological: He is alert. He has normal strength. No cranial nerve deficit or sensory deficit. He displays a negative Romberg sign.  Reflex Scores:      Bicep reflexes are 2+ on the right side and 2+ on the left side.      Patellar reflexes are 2+ on the right side and 2+ on the left side. Grip equal and strong bilateral upper extremities. Gait strong and steady. Able to perform rapid alternating movement without difficulty. Dix hall pike maneuver elicited vertigo. No nystagmus noted.    Skin: Skin is warm and dry.  Psychiatric: He has a normal mood and affect. His speech is normal and behavior is normal.  Vitals reviewed.      Assessment & Plan:    Problem List Items Addressed This Visit      Digestive   Hepatitis C virus infection without hepatic coma (Chronic)    Lost to follow-up.  Pending referral to GI to reestablish care.  Pending hepatitis C labs.      Relevant Orders   Ambulatory referral to Gastroenterology   HIV antibody   HCV Ab w Reflex to Quant PCR   Hepatitis C Antibody     Other   Obesity    Discussed diet and lifestyle measures. Advised to stop soda.   We also briefly discussed possibly considering Wellbutrin in the future.  I advised him however that I would like to see him lose weight on his own prior to any medication therapy.  We will start with basic lab work today and go from there with discussions at  follow-up visits.      Relevant Orders   Hemoglobin A1c   Lipid panel   VITAMIN D 25 Hydroxy (Vit-D Deficiency, Fractures)   Palpitations - Primary    Etiology nonspecific at this time.  Reassuring neurologic exam.  EKG shows no acute ischemia.  No prior EKG to compare to.  concern for LVH.  Unable to elicit vertigo with Dix-Hallpike maneuver.  Considering benign positional vertigo and also dehydration playing a role.  He is not experiencing palpitations today.  Pending extensive labs to look for anemia, thyroid.  Patient discussed palpitations, possible LVH on EKG, we jointly agreed appropriate to pursue cardiac evaluation for risk stratification and formal evaluation.  Patient remain hypervigilant.  Return precautions given      Relevant Orders   EKG 12-Lead (Completed)   CBC with Differential/Platelet   Comprehensive metabolic panel   TSH   Ambulatory referral to Cardiology   Screening for prostate cancer   Relevant Orders   PSA   Hepatitis C Antibody       I have discontinued Ollen Gross Fetterly's valACYclovir and predniSONE. I am also having him maintain his diclofenac.   No orders of the defined types were placed in this encounter.   Return precautions given.   Risks, benefits, and  alternatives of the medications and treatment plan prescribed today were discussed, and patient expressed understanding.   Education regarding symptom management and diagnosis given to patient on AVS.   Continue to follow with Burnard Hawthorne, FNP for routine health maintenance.   Doylene Canard and I agreed with plan.   [name], FNP

## 2017-10-11 NOTE — Assessment & Plan Note (Addendum)
Etiology nonspecific at this time.  Reassuring neurologic exam.  EKG shows no acute ischemia.  No prior EKG to compare to.  concern for LVH.  Unable to elicit vertigo with Dix-Hallpike maneuver.  Considering benign positional vertigo and also dehydration playing a role.  He is not experiencing palpitations today.  Pending extensive labs to look for anemia, thyroid.  Patient discussed palpitations, possible LVH on EKG, we jointly agreed appropriate to pursue cardiac evaluation for risk stratification and formal evaluation.  Patient remain hypervigilant.  Return precautions given

## 2017-10-11 NOTE — Assessment & Plan Note (Signed)
Discussed diet and lifestyle measures. Advised to stop soda.   We also briefly discussed possibly considering Wellbutrin in the future.  I advised him however that I would like to see him lose weight on his own prior to any medication therapy.  We will start with basic lab work today and go from there with discussions at follow-up visits.

## 2017-10-11 NOTE — Patient Instructions (Addendum)
Labs Today we discussed referrals, orders. GI to establish care, cardiology    I have placed these orders in the system for you.  Please be sure to give [place] a call if you have not heard from our office regarding scheduling a test or regarding referral in a timely manner.  It is very important that you let me know as soon as possible.   As discussed, etiology of palpitations is nonspecific at this time.  I would like for you to drink more water and also pay attention to when these episodes occur such as you are turning your head etc.  This will be helpful in determining  further evaluation.   If any point episodes become more frequent, or worse, or new symptoms present, please let me know immediately.      Palpitations A palpitation is the feeling that your heartbeat is irregular or is faster than normal. It may feel like your heart is fluttering or skipping a beat. Palpitations are usually not a serious problem. They may be caused by many things, including smoking, caffeine, alcohol, stress, and certain medicines. Although most causes of palpitations are not serious, palpitations can be a sign of a serious medical problem. In some cases, you may need further medical evaluation. Follow these instructions at home: Pay attention to any changes in your symptoms. Take these actions to help with your condition:  Avoid the following: ? Caffeinated coffee, tea, soft drinks, diet pills, and energy drinks. ? Chocolate. ? Alcohol.  Do not use any tobacco products, such as cigarettes, chewing tobacco, and e-cigarettes. If you need help quitting, ask your health care provider.  Try to reduce your stress and anxiety. Things that can help you relax include: ? Yoga. ? Meditation. ? Physical activity, such as swimming, jogging, or walking. ? Biofeedback. This is a method that helps you learn to use your mind to control things in your body, such as your heartbeats.  Get plenty of rest and sleep.  Take  over-the-counter and prescription medicines only as told by your health care provider.  Keep all follow-up visits as told by your health care provider. This is important.  Contact a health care provider if:  You continue to have a fast or irregular heartbeat after 24 hours.  Your palpitations occur more often. Get help right away if:  You have chest pain or shortness of breath.  You have a severe headache.  You feel dizzy or you faint. This information is not intended to replace advice given to you by your health care provider. Make sure you discuss any questions you have with your health care provider. Document Released: 02/10/2000 Document Revised: 07/18/2015 Document Reviewed: 10/28/2014 Elsevier Interactive Patient Education  [employer].

## 2017-10-15 LAB — HCV RT-PCR, QUANT (NON-GRAPH)

## 2017-10-15 LAB — HCV AB W REFLEX TO QUANT PCR: HCV Ab: >11 s/co ratio — ABNORMAL HIGH (ref 0.0–0.9)

## 2017-10-15 LAB — SPECIMEN STATUS REPORT

## 2017-10-15 LAB — HEPATITIS C ANTIBODY

## 2017-10-15 LAB — PSA: Prostate Specific Ag, Serum: 0.3 ng/mL (ref 0.0–4.0)

## 2017-11-21 ENCOUNTER — Other Ambulatory Visit: Payer: Self-pay | Admitting: Student

## 2017-11-21 ENCOUNTER — Telehealth: Payer: Self-pay | Admitting: Family

## 2017-11-21 DIAGNOSIS — Z1159 Encounter for screening for other viral diseases: Secondary | ICD-10-CM

## 2017-11-21 DIAGNOSIS — Z8619 Personal history of other infectious and parasitic diseases: Secondary | ICD-10-CM | POA: Diagnosis not present

## 2017-11-21 DIAGNOSIS — K7400 Hepatic fibrosis, unspecified: Secondary | ICD-10-CM

## 2017-11-21 NOTE — Telephone Encounter (Signed)
Pt called to get lab results for HIV drawn 10/11/17; spoke with Patina at [facility], regarding test because unable to locate results; Patina informs the that she will contact the lab and track results; pt verbalizes understanding; will route to office for notification of this encounter.

## 2017-11-21 NOTE — Telephone Encounter (Signed)
There seems that there was an issue with this lab and it was not drawn.

## 2017-11-22 NOTE — Telephone Encounter (Signed)
Seth Simmons,  Is he scheduled to see cardiology?

## 2017-11-22 NOTE — Telephone Encounter (Signed)
Lab appointment scheduled . Patient stated he will see GI on Wed.

## 2017-11-22 NOTE — Addendum Note (Signed)
Addended by: Burnard Hawthorne on: 11/22/2017 02:26 PM   Modules accepted: Orders

## 2017-11-22 NOTE — Telephone Encounter (Signed)
Call pt  I reordered HIV lab. He may schedule at his convenience  When is his GI and cardiology appt? I want to ensure these have been scheduled.

## 2017-11-25 NOTE — Telephone Encounter (Signed)
Yes, 10/30 with Dr. Rockey Situ.

## 2017-11-25 NOTE — Telephone Encounter (Signed)
noted 

## 2017-11-27 ENCOUNTER — Ambulatory Visit
Admission: RE | Admit: 2017-11-27 | Discharge: 2017-11-27 | Disposition: A | Discharge status: Home / Self Care | Payer: BLUE CROSS/BLUE SHIELD | Source: Ambulatory Visit | Attending: Student | Admitting: Student

## 2017-11-27 ENCOUNTER — Other Ambulatory Visit (INDEPENDENT_AMBULATORY_CARE_PROVIDER_SITE_OTHER): Payer: BLUE CROSS/BLUE SHIELD

## 2017-11-27 DIAGNOSIS — Z1159 Encounter for screening for other viral diseases: Secondary | ICD-10-CM | POA: Diagnosis not present

## 2017-11-27 DIAGNOSIS — K7400 Hepatic fibrosis, unspecified: Secondary | ICD-10-CM

## 2017-11-27 DIAGNOSIS — K769 Liver disease, unspecified: Secondary | ICD-10-CM | POA: Insufficient documentation

## 2017-11-27 DIAGNOSIS — K760 Fatty (change of) liver, not elsewhere classified: Secondary | ICD-10-CM | POA: Diagnosis not present

## 2017-11-27 DIAGNOSIS — Z8619 Personal history of other infectious and parasitic diseases: Secondary | ICD-10-CM | POA: Diagnosis not present

## 2017-11-27 DIAGNOSIS — Z23 Encounter for immunization: Secondary | ICD-10-CM | POA: Diagnosis not present

## 2017-11-28 LAB — HIV ANTIBODY (ROUTINE TESTING W REFLEX): HIV 1&2 Ab, 4th Generation: NONREACTIVE

## 2017-12-11 ENCOUNTER — Telehealth: Payer: Self-pay | Admitting: Family

## 2017-12-11 ENCOUNTER — Encounter: Payer: Self-pay | Admitting: Family

## 2017-12-11 ENCOUNTER — Ambulatory Visit (INDEPENDENT_AMBULATORY_CARE_PROVIDER_SITE_OTHER): Payer: BLUE CROSS/BLUE SHIELD | Admitting: Family

## 2017-12-11 VITALS — BP 118/70 | HR 84 | Temp 98.0°F | Ht 75.0 in | Wt 350.0 lb

## 2017-12-11 DIAGNOSIS — R7303 Prediabetes: Secondary | ICD-10-CM | POA: Diagnosis not present

## 2017-12-11 DIAGNOSIS — Z0000 Encounter for general adult medical examination without abnormal findings: Secondary | ICD-10-CM | POA: Diagnosis not present

## 2017-12-11 DIAGNOSIS — B182 Chronic viral hepatitis C: Secondary | ICD-10-CM | POA: Diagnosis not present

## 2017-12-11 DIAGNOSIS — Z23 Encounter for immunization: Secondary | ICD-10-CM | POA: Diagnosis not present

## 2017-12-11 MED ORDER — METFORMIN HCL ER 500 MG PO TB24
ORAL_TABLET | ORAL | 3 refills | Status: DC
Start: 2017-12-11 — End: 2018-02-12

## 2017-12-11 NOTE — Telephone Encounter (Signed)
Yes D3 is the right preparation. And 1000 unit is appropriate .

## 2017-12-11 NOTE — Progress Notes (Signed)
Subjective:    Patient ID: Seth Simmons, male    DOB: 1971/02/09, 47 y.o.   MRN: [id number]  CC: Seth Simmons is a 47 y.o. male who presents today for physical exam.    HPI: Complains of fatigue for several years, works two jobs.This has been unchanged. Gets about 3.5 hours per night of sleep due to work schedule. Has been doing this dual job for 6 years. Overeating. He is not depressed. No si/hi  Frustrated by weight.   No palpitations since last OV. 'every now and again.' No CP. Never got a call regarding cardiology.      GI 11/21/17- reviewed note. Has gotten first shot of Twinrix. Likely will pursue EGD however not scheduled yet. Follow up next month.    Colorectal  Cancer Screening: No family history.  Prostate Cancer Screening: Done , normal 09/2017. No problems urinating, hesitancy. Declines prostate exam today.  Lung Cancer Screening: No 30 year pack year history and > 55 years.  Immunizations       Tetanus - UTD        Pneumococcal - Candidate for.  Hepatitis C screening - done  HIV Screening-done  Labs: Screening labs today. Exercise: Gets regular exercise.  Alcohol use: On weekends. Able to work, relationships are not effected. Continues to feel the needs to cut down on drinking alcohol.  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 Denies feeling guilty for alcohol consumption or having an eye opener.  Smoking/tobacco use: 'rare cigar' user. Regular dental exams: UTD  Wears seat belt: Yes. Skin: no family h/o melanoma; no new skin lesions  HISTORY:  Past Medical History:  Diagnosis Date  . Anemia   . Arthritis   . Asthma   . Genital warts   . Hepatitis   . Obesity     Past Surgical History:  Procedure Laterality Date  . ESOPHAGOGASTRODUODENOSCOPY (EGD) WITH PROPOFOL N/A 10/21/2014   Procedure: ESOPHAGOGASTRODUODENOSCOPY (EGD) WITH PROPOFOL;  Surgeon: Josefine Class, MD;  Location: [facility] ENDOSCOPY;  Service: Endoscopy;   Laterality: N/A;  . HIP SURGERY Left    Family History  Problem Relation Age of Onset  . Diabetes Mother   . Diabetes Father   . Diabetes Brother   . Cancer Brother   . Diabetes Brother   . Colon cancer Neg Hx       ALLERGIES: Patient has no known allergies.  No current outpatient medications on file prior to visit.   No current facility-administered medications on file prior to visit.     Social History   Tobacco Use  . Smoking status: Light Tobacco Smoker    Types: Cigars  . Smokeless tobacco: Never Used  Substance Use Topics  . Alcohol use: Yes    Alcohol/week: 0.0 standard drinks    Comment: weekends;   . Drug use: No    Review of Systems  Constitutional: Negative for chills and fever.  HENT: Negative for congestion.   Respiratory: Negative for cough.   Cardiovascular: Negative for chest pain, palpitations and leg swelling.  Gastrointestinal: Negative for diarrhea, nausea and vomiting.  Genitourinary: Negative for difficulty urinating and hematuria.  Musculoskeletal: Negative for myalgias.  Skin: Negative for rash.  Neurological: Negative for headaches.  Hematological: Negative for adenopathy.  Psychiatric/Behavioral: Negative for confusion.      Objective:    BP 118/70 (BP Location: Left Arm, Patient Position: Sitting, Cuff Size: Large)   Pulse 84   Temp 98 F (36.7  C) (Oral)   Ht 6\' 3"  (1.905 m)   Wt (!) 350 lb (158.8 kg)   SpO2 96%   BMI 43.75 kg/m   BP Readings from Last 3 Encounters:  12/11/17 118/70  10/11/17 110/70  10/21/14 113/65   Wt Readings from Last 3 Encounters:  12/11/17 (!) 350 lb (158.8 kg)  10/11/17 (!) 351 lb 8 oz (159.4 kg)  10/21/14 (!) 327 lb (148.3 kg)    Physical Exam  Constitutional: He appears well-developed and well-nourished.  Neck: No thyroid mass and no thyromegaly present.  Cardiovascular: Regular rhythm and normal heart sounds.  Pulmonary/Chest: Effort normal and breath sounds normal. No respiratory  distress. He has no wheezes. He has no rhonchi. He has no rales.  Lymphadenopathy:       Head (right side): No submental, no submandibular, no tonsillar, no preauricular, no posterior auricular and no occipital adenopathy present.       Head (left side): No submental, no submandibular, no tonsillar, no preauricular, no posterior auricular and no occipital adenopathy present.    He has no cervical adenopathy.    He has no axillary adenopathy.  Neurological: He is alert.  Skin: Skin is warm and dry.  Psychiatric: He has a normal mood and affect. His speech is normal and behavior is normal.  Vitals reviewed.      Assessment & Plan:   Problem List Items Addressed This Visit      Digestive   Hepatitis C virus infection without hepatic coma (Chronic)    Follows with GI. Will follow        Other   Routine physical examination    Declines prostate exam in the absence of symptoms.  You are already following his PSA and I explained the importance of doing this test annually.  Encouraged exercise.  Advised to moderate his alcohol use or stop altogether.      Prediabetes - Primary    Start metformin due to prediabetes and also hope to help with weight loss. Will follow      Relevant Medications   metFORMIN (GLUCOPHAGE XR) 500 MG 24 hr tablet    Other Visit Diagnoses    Need for pneumococcal vaccination       Relevant Orders   Pneumococcal polysaccharide vaccine 23-valent greater than or equal to 2yo subcutaneous/IM (Completed)       I have discontinued Seth Simmons's diclofenac. I am also having him start on metFORMIN.   Meds ordered this encounter  Medications  . metFORMIN (GLUCOPHAGE XR) 500 MG 24 hr tablet    Sig: Start 500mg  PO qpm.    Dispense:  90 tablet    Refill:  3    Order Specific Question:   Supervising Provider    Answer:   Crecencio Mc [[id number]]    Return precautions given.   Risks, benefits, and alternatives of the medications and treatment plan  prescribed today were discussed, and patient expressed understanding.   Education regarding symptom management and diagnosis given to patient on AVS.   Continue to follow with Seth Hawthorne, FNP for routine health maintenance.   Seth Simmons and I agreed with plan.   Seth Paris, FNP

## 2017-12-11 NOTE — Assessment & Plan Note (Signed)
Follows with GI. Will follow 

## 2017-12-11 NOTE — Telephone Encounter (Signed)
Copied from [place] [id number]. Topic: Quick Communication - See Telephone Encounter >> Dec 11, 2017  9:12 AM Vernona Rieger wrote: CRM for notification. See Telephone encounter for: 12/11/17.  Patient just left the office and had his physical. He is at the pharmacy now trying to get Vitamin D 1,000 units. He is wanting to know is Vitamin D3-1000 units the same thing?

## 2017-12-11 NOTE — Progress Notes (Signed)
It has been scheduled on Oct 30 w/ Dr. Rockey Situ. Thanks, [employer]

## 2017-12-11 NOTE — Patient Instructions (Addendum)
Please moderate alcohol  Trial metformin   Today we discussed referrals, orders. CARDIOLOGY   I have placed these orders in the system for you.  Please be sure to give [place] a call if you have not heard from our office regarding this. We should hear from [place] within ONE week with information regarding your appointment. If not, please let me know immediately.    Your vitamin D level is low.  You may add over the counter vitamin D 1000 units by mouth daily for 12 weeks. Then you may call our office for a follow up visit and we can recheck level.     Health Maintenance, Male A healthy lifestyle and preventive care is important for your health and wellness. Ask your health care provider about what schedule of regular examinations is right for you. What should I know about weight and diet? Eat a Healthy Diet  Eat plenty of vegetables, fruits, whole grains, low-fat dairy products, and lean protein.  Do not eat a lot of foods high in solid fats, added sugars, or salt.  Maintain a Healthy Weight Regular exercise can help you achieve or maintain a healthy weight. You should:  Do at least 150 minutes of exercise each week. The exercise should increase your heart rate and make you sweat (moderate-intensity exercise).  Do strength-training exercises at least twice a week.  Watch Your Levels of Cholesterol and Blood Lipids  Have your blood tested for lipids and cholesterol every 5 years starting at 46 years of age. If you are at high risk for heart disease, you should start having your blood tested when you are 46 years old. You may need to have your cholesterol levels checked more often if: ? Your lipid or cholesterol levels are high. ? You are older than 46 years of age. ? You are at high risk for heart disease.  What should I know about cancer screening? Many types of cancers can be detected early and may often be prevented. Lung Cancer  You should be screened every year for lung cancer  if: ? You are a current smoker who has smoked for at least 30 years. ? You are a former smoker who has quit within the past 15 years.  Talk to your health care provider about your screening options, when you should start screening, and how often you should be screened.  Colorectal Cancer  Routine colorectal cancer screening usually begins at 46 years of age and should be repeated every 5-10 years until you are 46 years old. You may need to be screened more often if early forms of precancerous polyps or small growths are found. Your health care provider may recommend screening at an earlier age if you have risk factors for colon cancer.  Your health care provider may recommend using home test kits to check for hidden blood in the stool.  A small camera at the end of a tube can be used to examine your colon (sigmoidoscopy or colonoscopy). This checks for the earliest forms of colorectal cancer.  Prostate and Testicular Cancer  Depending on your age and overall health, your health care provider may do certain tests to screen for prostate and testicular cancer.  Talk to your health care provider about any symptoms or concerns you have about testicular or prostate cancer.  Skin Cancer  Check your skin from head to toe regularly.  Tell your health care provider about any new moles or changes in moles, especially if: ? There is a  change in a mole's size, shape, or color. ? You have a mole that is larger than a pencil eraser.  Always use sunscreen. Apply sunscreen liberally and repeat throughout the day.  Protect yourself by wearing long sleeves, pants, a wide-brimmed hat, and sunglasses when outside.  What should I know about heart disease, diabetes, and high blood pressure?  If you are 19-60 years of age, have your blood pressure checked every 3-5 years. If you are 69 years of age or older, have your blood pressure checked every year. You should have your blood pressure measured  twice-once when you are at a hospital or clinic, and once when you are not at a hospital or clinic. Record the average of the two measurements. To check your blood pressure when you are not at a hospital or clinic, you can use: ? An automated blood pressure machine at a pharmacy. ? A home blood pressure monitor.  Talk to your health care provider about your target blood pressure.  If you are between 2-53 years old, ask your health care provider if you should take aspirin to prevent heart disease.  Have regular diabetes screenings by checking your fasting blood sugar level. ? If you are at a normal weight and have a low risk for diabetes, have this test once every three years after the age of 44. ? If you are overweight and have a high risk for diabetes, consider being tested at a younger age or more often.  A one-time screening for abdominal aortic aneurysm (AAA) by ultrasound is recommended for men aged 25-75 years who are current or former smokers. What should I know about preventing infection? Hepatitis B If you have a higher risk for hepatitis B, you should be screened for this virus. Talk with your health care provider to find out if you are at risk for hepatitis B infection. Hepatitis C Blood testing is recommended for:  Everyone born from [birth year] through 1964.  Anyone with known risk factors for hepatitis C.  Sexually Transmitted Diseases (STDs)  You should be screened each year for STDs including gonorrhea and chlamydia if: ? You are sexually active and are younger than 46 years of age. ? You are older than 46 years of age and your health care provider tells you that you are at risk for this type of infection. ? Your sexual activity has changed since you were last screened and you are at an increased risk for chlamydia or gonorrhea. Ask your health care provider if you are at risk.  Talk with your health care provider about whether you are at high risk of being infected with HIV.  Your health care provider may recommend a prescription medicine to help prevent HIV infection.  What else can I do?  Schedule regular health, dental, and eye exams.  Stay current with your vaccines (immunizations).  Do not use any tobacco products, such as cigarettes, chewing tobacco, and e-cigarettes. If you need help quitting, ask your health care provider.  Limit alcohol intake to no more than 2 drinks per day. One drink equals 12 ounces of beer, 5 ounces of wine, or 1 ounces of hard liquor.  Do not use street drugs.  Do not share needles.  Ask your health care provider for help if you need support or information about quitting drugs.  Tell your health care provider if you often feel depressed.  Tell your health care provider if you have ever been abused or do not feel safe at home.  This information is not intended to replace advice given to you by your health care provider. Make sure you discuss any questions you have with your health care provider. Document Released: 08/11/2007 Document Revised: 10/12/2015 Document Reviewed: 11/16/2014 Elsevier Interactive Patient Education  2018 Elsevier Inc.  

## 2017-12-11 NOTE — Assessment & Plan Note (Signed)
Declines prostate exam in the absence of symptoms.  You are already following his PSA and I explained the importance of doing this test annually.  Encouraged exercise.  Advised to moderate his alcohol use or stop altogether.

## 2017-12-11 NOTE — Telephone Encounter (Signed)
Patient advised of below and verbalized understanding.  

## 2017-12-11 NOTE — Assessment & Plan Note (Signed)
Start metformin due to prediabetes and also hope to help with weight loss. Will follow

## 2017-12-24 NOTE — Progress Notes (Signed)
Cardiology Office Note  Date:  12/25/2017   ID:  Seth Simmons, DOB May 14, 1970, MRN [id number]  PCP:  Burnard Hawthorne, FNP   Chief Complaint  Patient presents with  . New Patient (Initial Visit)    Palpatations, cardiac hx doesn't remember where. Medications reviewed verbally.    HPI:  [name] is a 78 old gentleman with past medical history of Chronic hepatitis C, treated Morbid obesity Alcohol Referred by [name] for consultation of his palpitations, abnormal EKG  Reports long history of periodic tachycardia Typically happens when he is at work [employer] himself on a regular basis, most of the time has no problems  Once a month will describe 3 minutes of tachycardia, comes on acutely, resolves without intervention Typically will happen at work  Little dizzy when it occurs Does not really think he needs a medication for it feels it up to his temple  Last episode 1 month ago  No restrictions to his activity, denies any shortness of breath or chest pain  EKG personally reviewed by myself on todays visit Shows normal sinus rhythm rate 58 bpm no significant ST or T wave changes   PMH:   has a past medical history of Anemia, Arthritis, Asthma, Genital warts, Hepatitis, and Obesity.  PSH:    Past Surgical History:  Procedure Laterality Date  . ESOPHAGOGASTRODUODENOSCOPY (EGD) WITH PROPOFOL N/A 10/21/2014   Procedure: ESOPHAGOGASTRODUODENOSCOPY (EGD) WITH PROPOFOL;  Surgeon: Josefine Class, MD;  Location: [facility] END[facility]COPY;  Service: Endoscopy;  Laterality: N/A;  . HIP SURGERY Left     Current Outpatient Medications  Medication Sig Dispense Refill  . metFORMIN (GLUCOPHAGE XR) 500 MG 24 hr tablet Start 500mg  PO qpm. 90 tablet 3   No current facility-administered medications for this visit.      Allergies:   Patient has no known allergies.   Social History:  The patient  reports that he has been smoking cigars. He has never used smokeless tobacco.  He reports that he drinks alcohol. He reports that he does not use drugs.   Family History:   family history includes Cancer in his brother; Diabetes in his brother, brother, father, and mother.    Review of Systems: Review of Systems  Constitutional: Negative.   Respiratory: Negative.   Cardiovascular: Positive for palpitations.       Tachycardia  Gastrointestinal: Negative.   Musculoskeletal: Negative.   Neurological: Negative.   Psychiatric/Behavioral: Negative.   All other systems reviewed and are negative.    PHYSICAL EXAM: VS:  BP 118/66 (BP Location: Right Arm, Patient Position: Sitting, Cuff Size: Large)   Pulse 62   Ht 6\' 3"  (1.905 m)   Wt (!) 353 lb 12 oz (160.5 kg)   BMI 44.22 kg/m  , BMI Body mass index is 44.22 kg/m. GEN: Well nourished, well developed, in no acute distress , obese HEENT: normal  Neck: no JVD, carotid bruits, or masses Cardiac: RRR; no murmurs, rubs, or gallops,no edema  Respiratory:  clear to auscultation bilaterally, normal work of breathing GI: soft, nontender, nondistended, + BS MS: no deformity or atrophy  Skin: warm and dry, no rash Neuro:  Strength and sensation are intact Psych: euthymic mood, full affect   Recent Labs: 10/11/2017: ALT 12; BUN 17; Creatinine, Ser 0.84; Hemoglobin 13.7; Platelets 174.0; Potassium 4.1; Sodium 142; TSH 1.34    Lipid Panel Lab Results  Component Value Date   CHOL 186 10/11/2017   HDL 56.00 10/11/2017   LDLCALC 111 (H) 10/11/2017  TRIG 96.0 10/11/2017      Wt Readings from Last 3 Encounters:  12/25/17 (!) 353 lb 12 oz (160.5 kg)  12/11/17 (!) 350 lb (158.8 kg)  10/11/17 (!) 351 lb 8 oz (159.4 kg)       ASSESSMENT AND PLAN:  Paroxysmal tachycardia (HCC) - Plan: EKG 12-Lead Rare episodes of tachycardia once or twice a month lasting 3 minutes at a time Episodes are too short for pill in the pocket approach He does not really want to take a medication on a daily basis Recommended if  symptoms get worse he could call our office, we would place a extended/event monitor He is happy to watch his symptoms for now Suspect likely a atrial tachycardia, unable to exclude run of SVT We downloaded several apps on his phone for pulse monitoring He will call us with approximate heart rates when he has episodes  Palpitations - Plan: EKG 12-Lead Likely having APCs PVCs, unable to exclude atrial tachycardia  Prediabetes Currently on metformin He has changed his diet, would like to come off the medication Recommend he continue to decrease his sugar intake, carbohydrate intake Discussed with primary care, can likely stop the medication if he does well  Morbid obesity ([place]) We have encouraged continued exercise, careful diet management in an effort to lose weight. Dietary guide provided  Disposition:   F/U as needed  Patient was seen in consultation for [name] and will be referred back to her office for ongoing care of the issues detailed above   Total encounter time more than 60 minutes  Greater than 50% was spent in counseling and coordination of care with the patient    Orders Placed This Encounter  Procedures  . EKG 12-Lead     Signed, Esmond Plants, M.D., Ph.D. 12/25/2017  [place], [place]

## 2017-12-25 ENCOUNTER — Encounter: Payer: Self-pay | Admitting: Cardiovascular Disease

## 2017-12-25 ENCOUNTER — Ambulatory Visit: Payer: BLUE CROSS/BLUE SHIELD | Admitting: Cardiovascular Disease

## 2017-12-25 VITALS — BP 118/66 | HR 62 | Ht 75.0 in | Wt 353.8 lb

## 2017-12-25 DIAGNOSIS — I479 Paroxysmal tachycardia, unspecified: Secondary | ICD-10-CM | POA: Diagnosis not present

## 2017-12-25 DIAGNOSIS — R002 Palpitations: Secondary | ICD-10-CM

## 2017-12-25 DIAGNOSIS — R7303 Prediabetes: Secondary | ICD-10-CM | POA: Diagnosis not present

## 2017-12-25 NOTE — Patient Instructions (Addendum)
Monitor heart rate Call if it runs high We could order a zio monitor  Medication Instructions:  No changes  If you need a refill on your cardiac medications before your next appointment, please call your pharmacy.    Lab work: No new labs needed   If you have labs (blood work) drawn today and your tests are completely normal, you will receive your results only by: Marland Kitchen MyChart Message (if you have MyChart) OR . A paper copy in the mail If you have any lab test that is abnormal or we need to change your treatment, we will call you to review the results.   Testing/Procedures: No new testing needed   Follow-Up: At [facility], you and your health needs are our priority.  As part of our continuing mission to provide you with exceptional heart care, we have created designated Provider Care Teams.  These Care Teams include your primary Cardiologist (physician) and Advanced Practice Providers (APPs -  Physician Assistants and Nurse Practitioners) who all work together to provide you with the care you need, when you need it.  . You will need a follow up appointment as needed .   Please call our office 2 months in advance to schedule this appointment.    . Providers on your designated Care Team:   . Murray Hodgkins, NP . Christell Faith, PA-C . Marrianne Mood, PA-C  Any Other Special Instructions Will Be Listed Below (If Applicable).  For educational health videos Log in to : [email or web address] Or : [email or web address], password : [email or web address]

## 2018-01-01 DIAGNOSIS — Z23 Encounter for immunization: Secondary | ICD-10-CM | POA: Diagnosis not present

## 2018-02-05 ENCOUNTER — Ambulatory Visit: Payer: BLUE CROSS/BLUE SHIELD | Admitting: Family

## 2018-02-12 ENCOUNTER — Ambulatory Visit: Payer: BLUE CROSS/BLUE SHIELD | Admitting: Family

## 2018-02-12 ENCOUNTER — Encounter: Payer: Self-pay | Admitting: Family

## 2018-02-12 DIAGNOSIS — R7303 Prediabetes: Secondary | ICD-10-CM | POA: Diagnosis not present

## 2018-02-12 NOTE — Patient Instructions (Signed)
You are doing awesome-no issues congratulations on weight loss.  Continue to good work!  You are some ideas below  This is  Dr. Lupita Dawn  example of a  "Low GI"  Diet:  It will allow you to lose 4 to 8  lbs  per month if you follow it carefully.  Your goal with exercise is a minimum of 30 minutes of aerobic exercise 5 days per week (Walking does not count once it becomes easy!)    All of the foods can be found at grocery stores and in bulk at [employer].  The Atkins protein bars and shakes are available in more varieties at Target, [employer] and [place].     7 AM Breakfast:  Choose from the following:  Low carbohydrate Protein  Shakes (I recommend the  Premier Protein chocolate shakes,  EAS AdvantEdge "Carb Control" shakes  Or the Atkins shakes all are under 3 net carbs)     a scrambled egg/bacon/cheese burrito made with Mission's "carb balance" whole wheat tortilla  (about 10 net carbs )  Regulatory affairs officer (basically a quiche without the pastry crust) that is eaten cold and very convenient way to get your eggs.  8 carbs)  If you make your own protein shakes, avoid bananas and pineapple,  And use low carb greek yogurt or original /unsweetened almond or soy milk    Avoid cereal and bananas, oatmeal and cream of wheat and grits. They are loaded with carbohydrates!   10 AM: high protein snack:  Protein bar by Atkins (the snack size, under 200 cal, usually < 6 net carbs).    A stick of cheese:  Around 1 carb,  100 cal     Dannon Light n Fit [place] Yogurt  (80 cal, 8 carbs)  Other so called "protein bars" and Greek yogurts tend to be loaded with carbohydrates.  Remember, in food advertising, the word "energy" is synonymous for " carbohydrate."  Lunch:   A Sandwich using the bread choices listed, Can use any  Eggs,  lunchmeat, grilled meat or canned tuna), avocado, regular mayo/mustard  and cheese.  A Salad using blue cheese, ranch,  Goddess or vinagrette,  Avoid taco  shells, croutons or "confetti" and no "candied nuts" but regular nuts OK.   No pretzels, nabs  or chips.  Pickles and miniature sweet peppers are a good low carb alternative that provide a "crunch"  The bread is the only source of carbohydrate in a sandwich and  can be decreased by trying some of the attached alternatives to traditional loaf bread   Avoid "Low fat dressings, as well as [place] dressings They are loaded with sugar!   3 PM/ Mid day  Snack:  Consider  1 ounce of  almonds, walnuts, pistachios, pecans, peanuts,  Macadamia nuts or a nut medley.  Avoid "granola and granola bars "  Mixed nuts are ok in moderation as long as there are no raisins,  cranberries or dried fruit.   KIND bars are OK if you get the low glycemic index variety   Try the prosciutto/mozzarella cheese sticks by Fiorruci  In deli /backery section   High protein      6 PM  Dinner:     Meat/fowl/fish with a green salad, and either broccoli, cauliflower, green beans, spinach, brussel sprouts or  Lima beans. DO NOT BREAD THE PROTEIN!!      There is a low carb pasta by Dreamfield's that is  acceptable and tastes great: only 5 digestible carbs/serving.( All grocery stores but BJs carry it ) Several ready made meals are available low carb:   Try Michel Angelo's chicken piccata or chicken or eggplant parm over low carb pasta.(Lowes and BJs)   Marjory Lies Sanchez's "Carnitas" (pulled pork, no sauce,  0 carbs) or his beef pot roast to make a dinner burrito (at BJ's)  Pesto over low carb pasta (bj's sells a good quality pesto in the center refrigerated section of the deli   Try satueeing  Cheral Marker with mushroooms as a good side   [employer] Giant makes a mashed cauliflower that tastes like mashed potatoes  Whole wheat pasta is still full of digestible carbs and  Not as low in glycemic index as Dreamfield's.   Brown rice is still rice,  So skip the rice and noodles if you eat [place] or [place] (or at least limit to  1/2 cup)  9 PM snack :   Breyer's "low carb" fudgsicle or  ice cream bar (Carb Smart line), or  Weight Watcher's ice cream bar , or another "no sugar added" ice cream;  a serving of fresh berries/cherries with whipped cream   Cheese or DANNON'S LlGHT N FIT GREEK YOGURT  8 ounces of [place] unsweetened almond/cococunut milk    Treat yourself to a parfait made with whipped cream blueberiies, walnuts and vanilla greek yogurt  Avoid bananas, pineapple, grapes  and watermelon on a regular basis because they are high in sugar.  THINK OF THEM AS DESSERT  Remember that snack Substitutions should be less than 10 NET carbs per serving and meals < 20 carbs. Remember to subtract fiber grams to get the "net carbs."  @TULLOBREADPACKAGE @

## 2018-02-12 NOTE — Progress Notes (Signed)
Subjective:    Patient ID: Seth Simmons, male    DOB: 07-18-1970, 47 y.o.   MRN: [id number]  CC: Antonio Creswell is a 47 y.o. male who presents today for follow up.   HPI: Overall feels well today.  Still working 2 jobs;  complains of fatigue however has noticed some improvement with weight loss.    Struggle some with overeating.  No depression or anxiety.  No thoughts of hurting himself or anyone else  No longer taking metformin due to epigastric burning which has resolved since stopping medication. Stopped soda and eliminated breads.   Continues to follow with gastroenterology. No palpitations today.  These occurrences are rare.  No chest pain  Social occasions with drinking 3-4 drinks in 1 sitting .Doesn't feel the needs to cut down on drinking alcohol.  People do not feel annoyed by alcohol use. Denies feeling guilty for alcohol consumption or having an eye opener.    Has been evaluated by cardiology, Dr. Rockey Situ.  Suspect atrial tachycardia.  Monitoring for now. HISTORY:  Past Medical History:  Diagnosis Date  . Anemia   . Arthritis   . Asthma   . Genital warts   . Hepatitis   . Obesity    Past Surgical History:  Procedure Laterality Date  . ESOPHAGOGASTRODUODENOSCOPY (EGD) WITH PROPOFOL N/A 10/21/2014   Procedure: ESOPHAGOGASTRODUODENOSCOPY (EGD) WITH PROPOFOL;  Surgeon: Josefine Class, MD;  Location[facility] END[facility];  Service: Endoscopy;  Laterality: N/A;  . HIP SURGERY Left    Family History  Problem Relation Age of Onset  . Diabetes Mother   . Diabetes Father   . Diabetes Brother   . Cancer Brother   . Diabetes Brother   . Colon cancer Neg Hx     Allergies: Patient has no known allergies. Current Outpatient Medications on File Prior to Visit  Medication Sig Dispense Refill  . cholecalciferol (VITAMIN D3) 25 MCG (1000 UT) tablet Take 1,000 Units by mouth daily.     No current facility-administered medications on file prior to visit.     Social  History   Tobacco Use  . Smoking status: Light Tobacco Smoker    Types: Cigars  . Smokeless tobacco: Never Used  Substance Use Topics  . Alcohol use: Yes    Alcohol/week: 0.0 standard drinks    Comment: weekends;   . Drug use: No    Review of Systems  Constitutional: Positive for fatigue. Negative for chills and fever.  Respiratory: Negative for cough.   Cardiovascular: Positive for palpitations. Negative for chest pain.  Gastrointestinal: Negative for nausea and vomiting.  Psychiatric/Behavioral: Negative for sleep disturbance and suicidal ideas.      Objective:    BP 110/72 (BP Location: Left Arm, Patient Position: Sitting, Cuff Size: Large)   Pulse 70   Temp 98.2 F (36.8 C)   Wt (!) 334 lb 6.4 oz (151.7 kg)   SpO2 95%   BMI 41.80 kg/m  BP Readings from Last 3 Encounters:  02/12/18 110/72  12/25/17 118/66  12/11/17 118/70   Wt Readings from Last 3 Encounters:  02/12/18 (!) 334 lb 6.4 oz (151.7 kg)  12/25/17 (!) 353 lb 12 oz (160.5 kg)  12/11/17 (!) 350 lb (158.8 kg)    Physical Exam Vitals signs reviewed.  Constitutional:      Appearance: He is well-developed.  Cardiovascular:     Rate and Rhythm: Regular rhythm.     Heart sounds: Normal heart sounds.  Pulmonary:     Effort: Pulmonary  effort is normal. No respiratory distress.     Breath sounds: Normal breath sounds. No wheezing, rhonchi or rales.  Skin:    General: Skin is warm and dry.  Neurological:     Mental Status: He is alert.  Psychiatric:        Speech: Speech normal.        Behavior: Behavior normal.        Assessment & Plan:   Problem List Items Addressed This Visit      Other   Prediabetes    Suspect improved with recent dietary changes.  Congratulated patient on significant weight loss.  Advised him to aim for 1 pound a week.  Discussed diet at length today.  Unfortunately due to his alcohol use, he is not a candidate for Wellbutrin.  Follow-up in 6 months.          I have  discontinued Goku Tousley's metFORMIN. I am also having him maintain his cholecalciferol.   No orders of the defined types were placed in this encounter.   Return precautions given.   Risks, benefits, and alternatives of the medications and treatment plan prescribed today were discussed, and patient expressed understanding.   Education regarding symptom management and diagnosis given to patient on AVS.  Continue to follow with Burnard Hawthorne, FNP for routine health maintenance.   Doylene Canard and I agreed with plan.   [name], FNP

## 2018-02-12 NOTE — Assessment & Plan Note (Signed)
Suspect improved with recent dietary changes.  Congratulated patient on significant weight loss.  Advised him to aim for 1 pound a week.  Discussed diet at length today.  Unfortunately due to his alcohol use, he is not a candidate for Wellbutrin.  Follow-up in 6 months.

## 2018-02-18 DIAGNOSIS — S335XXA Sprain of ligaments of lumbar spine, initial encounter: Secondary | ICD-10-CM | POA: Diagnosis not present

## 2018-02-18 DIAGNOSIS — M5431 Sciatica, right side: Secondary | ICD-10-CM | POA: Diagnosis not present

## 2018-03-14 ENCOUNTER — Ambulatory Visit: Payer: BLUE CROSS/BLUE SHIELD | Admitting: Family

## 2018-03-19 ENCOUNTER — Ambulatory Visit: Payer: BLUE CROSS/BLUE SHIELD | Admitting: Family

## 2018-03-26 ENCOUNTER — Ambulatory Visit: Payer: BLUE CROSS/BLUE SHIELD | Admitting: Family

## 2018-04-02 ENCOUNTER — Ambulatory Visit: Payer: BLUE CROSS/BLUE SHIELD | Admitting: Family

## 2018-04-02 ENCOUNTER — Encounter: Payer: Self-pay | Admitting: Family

## 2018-04-02 VITALS — BP 110/70 | HR 58 | Temp 98.4°F | Resp 18 | Ht 75.0 in | Wt 318.4 lb

## 2018-04-02 DIAGNOSIS — E559 Vitamin D deficiency, unspecified: Secondary | ICD-10-CM

## 2018-04-02 DIAGNOSIS — R7303 Prediabetes: Secondary | ICD-10-CM | POA: Diagnosis not present

## 2018-04-02 LAB — HEMOGLOBIN A1C: Hgb A1c MFr Bld: 5.5 % (ref 4.6–6.5)

## 2018-04-02 LAB — VITAMIN D 25 HYDROXY (VIT D DEFICIENCY, FRACTURES): VITD: 24.78 ng/mL — AB (ref 30.00–100.00)

## 2018-04-02 NOTE — Progress Notes (Signed)
Subjective:    Patient ID: Seth Simmons, male    DOB: February 23, 1971, 48 y.o.   MRN: [id number]  CC: Seth Simmons is a 48 y.o. male who presents today for follow up.   HPI: Feels well today, no complaints or concerns. Very pleased with his intentional Weight loss.  Feels better.  States knee and low back pain has resolved.  Eating less carbs, no soda.  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 No chest pain. Has not seen gastroenterology since September of last year.       Following with [facility]  HISTORY:  Past Medical History:  Diagnosis Date  . Anemia   . Arthritis   . Asthma   . Genital warts   . Hepatitis   . Obesity    Past Surgical History:  Procedure Laterality Date  . ESOPHAGOGASTRODUODENOSCOPY (EGD) WITH PROPOFOL N/A 10/21/2014   Procedure: ESOPHAGOGASTRODUODENOSCOPY (EGD) WITH PROPOFOL;  Surgeon: Josefine Class, MD;  Location: [facility] ENDOSCOPY;  Service: Endoscopy;  Laterality: N/A;  . HIP SURGERY Left    Family History  Problem Relation Age of Onset  . Diabetes Mother   . Diabetes Father   . Diabetes Brother   . Cancer Brother   . Diabetes Brother   . Colon cancer Neg Hx     Allergies: Patient has no known allergies. Current Outpatient Medications on File Prior to Visit  Medication Sig Dispense Refill  . cholecalciferol (VITAMIN D3) 25 MCG (1000 UT) tablet Take 1,000 Units by mouth daily.     No current facility-administered medications on file prior to visit.     Social History   Tobacco Use  . Smoking status: Light Tobacco Smoker    Types: Cigars  . Smokeless tobacco: Never Used  Substance Use Topics  . Alcohol use: Yes    Alcohol/week: 0.0 standard drinks    Comment: weekends;   . Drug use: No    Review of Systems  Constitutional: Negative for chills and fever.  Respiratory: Negative for cough.   Cardiovascular: Negative for chest pain and palpitations.  Gastrointestinal: Negative for nausea and  vomiting.      Objective:    BP 110/70   Pulse (!) 58   Temp 98.4 F (36.9 C) (Oral)   Resp 18   Ht 6\' 3"  (1.905 m)   Wt (!) 318 lb 6 oz (144.4 kg)   SpO2 98%   BMI 39.79 kg/m  BP Readings from Last 3 Encounters:  04/02/18 110/70  02/12/18 110/72  12/25/17 118/66   Wt Readings from Last 3 Encounters:  04/02/18 (!) 318 lb 6 oz (144.4 kg)  02/12/18 (!) 334 lb 6.4 oz (151.7 kg)  12/25/17 (!) 353 lb 12 oz (160.5 kg)    Physical Exam Vitals signs reviewed.  Constitutional:      Appearance: He is well-developed.  Cardiovascular:     Rate and Rhythm: Regular rhythm.     Heart sounds: Normal heart sounds.  Pulmonary:     Effort: Pulmonary effort is normal. No respiratory distress.     Breath sounds: Normal breath sounds. No wheezing, rhonchi or rales.  Skin:    General: Skin is warm and dry.  Neurological:     Mental Status: He is alert.  Psychiatric:        Speech: Speech normal.        Behavior: Behavior normal.        Assessment & Plan:   Problem List Items Addressed  This Visit      Other   Morbid obesity (Spray)    Congratulated patient on continued weight loss.  He has made very positive lifestyle changes.  Discussed target weight, jointly agreed on approximately 280 at this time. Will  Look at BMI at that time.  Patient will follow-up with me 6 to 8 months, sooner if needed      Prediabetes - Primary   Relevant Orders   Hemoglobin A1c    Other Visit Diagnoses    Vitamin D deficiency       Relevant Orders   VITAMIN D 25 Hydroxy (Vit-D Deficiency, Fractures)       I am having Seth Simmons maintain his cholecalciferol.   No orders of the defined types were placed in this encounter.   Return precautions given.   Risks, benefits, and alternatives of the medications and treatment plan prescribed today were discussed, and patient expressed understanding.   Education regarding symptom management and diagnosis given to patient on AVS.  Continue to  follow with Burnard Hawthorne, FNP for routine health maintenance.   Seth Simmons and I agreed with plan.   [name], FNP

## 2018-04-02 NOTE — Assessment & Plan Note (Signed)
Congratulated patient on continued weight loss.  He has made very positive lifestyle changes.  Discussed target weight, jointly agreed on approximately 280 at this time. Will  Look at BMI at that time.  Patient will follow-up with me 6 to 8 months, sooner if needed

## 2018-04-02 NOTE — Patient Instructions (Addendum)
Would think due for [facility] around march 2020 for follow up and ask when your next [employer] ( Hep A and B ) vaccine is due. You may call them at : [phone]  You are doing awesome!!! Hats off to you!

## 2018-05-22 DIAGNOSIS — Z8619 Personal history of other infectious and parasitic diseases: Secondary | ICD-10-CM | POA: Diagnosis not present

## 2018-05-22 DIAGNOSIS — Z789 Other specified health status: Secondary | ICD-10-CM | POA: Diagnosis not present

## 2018-05-22 DIAGNOSIS — Z23 Encounter for immunization: Secondary | ICD-10-CM | POA: Diagnosis not present

## 2018-06-10 ENCOUNTER — Telehealth: Payer: Self-pay

## 2018-06-10 ENCOUNTER — Telehealth: Payer: Self-pay | Admitting: Family

## 2018-06-10 NOTE — Telephone Encounter (Signed)
Copied from [place] [id number]. Topic: Appointment Scheduling - Scheduling Inquiry for Clinic >> Jun 09, 2018  4:12 PM Valla Leaver wrote: Reason for CRM: Needs appt. No answer 3x

## 2018-06-10 NOTE — Telephone Encounter (Signed)
Called pt to set up a virtual visit, no answer.

## 2018-06-11 ENCOUNTER — Ambulatory Visit: Payer: BLUE CROSS/BLUE SHIELD | Admitting: Family

## 2018-06-13 ENCOUNTER — Encounter: Payer: Self-pay | Admitting: Family

## 2018-06-13 ENCOUNTER — Ambulatory Visit (INDEPENDENT_AMBULATORY_CARE_PROVIDER_SITE_OTHER): Payer: BLUE CROSS/BLUE SHIELD | Admitting: Family

## 2018-06-13 DIAGNOSIS — R062 Wheezing: Secondary | ICD-10-CM | POA: Diagnosis not present

## 2018-06-13 MED ORDER — CETIRIZINE HCL 10 MG PO TABS: 10.0000 mg | ORAL_TABLET | Freq: Every day | ORAL | 11 refills | Status: DC

## 2018-06-13 MED ORDER — ALBUTEROL SULFATE HFA 108 (90 BASE) MCG/ACT IN AERS
2.0000 | INHALATION_SPRAY | Freq: Four times a day (QID) | RESPIRATORY_TRACT | 0 refills | Status: DC | PRN
Start: 2018-06-13 — End: 2018-07-07

## 2018-06-13 MED ORDER — BUDESONIDE-FORMOTEROL FUMARATE 80-4.5 MCG/ACT IN AERO: 2.0000 | INHALATION_SPRAY | Freq: Two times a day (BID) | RESPIRATORY_TRACT | 3 refills | Status: DC

## 2018-06-13 NOTE — Progress Notes (Signed)
This visit type was conducted due to national recommendations for restrictions regarding the COVID-19 pandemic (e.g. social distancing).  This format is felt to be most appropriate for this patient at this time.  All issues noted in this document were discussed and addressed.  No physical exam was performed (except for noted visual exam findings with Video Visits).  Interactive audio and video telecommunications were attempted between this provider and patient, however failed, due to patient having technical difficulties or patient did not have access to video capability.  I could see patient initially and he could see me however he could not hear me.  We continued and completed visit with audio only.    Virtual Visit via Video Note  I connected with@  on 06/13/18 at 10:30 AM EDT by a video enabled telemedicine application and verified that I am speaking with the correct person using two identifiers.  Location patient: home Location provider:work or home office Persons participating in the virtual visit: patient, provider  I discussed the limitations of evaluation and management by telemedicine and the availability of in person appointments. The patient expressed understanding and agreed to proceed.   HPI: CC: wheezing, sob for past year, comes and goes. If working too hard at work, will come on. No CP.  'thinks asthma is coming back when child.' Resolved in 35 -56 years old.  Rare cough with clear congestion. No sinus pressure, sore throat.   used to have inhaler.   Thinks pollen is contributory and also change from  Hot to cold temperatures; moved from [place] to [place] 9 years ago.  Describes unable to blow in breathilizer when pulled over last weekend for DUI. Lost [place] job. Works for [employer].  Doesn't feel the needs to cut down on drinking alcohol.  People do not feel annoyed by alcohol use. Denies feeling guilty for alcohol consumption or having an eye opener.    ROS: See  pertinent positives and negatives per HPI.  Past Medical History:  Diagnosis Date  . Anemia   . Arthritis   . Asthma   . Genital warts   . Hepatitis   . Obesity     Past Surgical History:  Procedure Laterality Date  . ESOPHAGOGASTRODUODENOSCOPY (EGD) WITH PROPOFOL N/A 10/21/2014   Procedure: ESOPHAGOGASTRODUODENOSCOPY (EGD) WITH PROPOFOL;  Surgeon: Josefine Class, MD;  Location[facility] ENDOSCOPY;  Service: Endoscopy;  Laterality: N/A;  . HIP SURGERY Left     Family History  Problem Relation Age of Onset  . Diabetes Mother   . Diabetes Father   . Diabetes Brother   . Cancer Brother   . Diabetes Brother   . Colon cancer Neg Hx     SOCIAL HX: non smoker   Current Outpatient Medications:  .  cholecalciferol (VITAMIN D3) 25 MCG (1000 UT) tablet, Take 1,000 Units by mouth daily., Disp: , Rfl:  .  albuterol (VENTOLIN HFA) 108 (90 Base) MCG/ACT inhaler, Inhale 2 puffs into the lungs every 6 (six) hours as needed for wheezing or shortness of breath., Disp: 1 Inhaler, Rfl: 0 .  budesonide-formoterol (SYMBICORT) 80-4.5 MCG/ACT inhaler, Inhale 2 puffs into the lungs 2 (two) times daily., Disp: 1 Inhaler, Rfl: 3 .  cetirizine (ZYRTEC) 10 MG tablet, Take 1 tablet (10 mg total) by mouth daily., Disp: 30 tablet, Rfl: 11   ASSESSMENT AND PLAN:  Discussed the following assessment and plan:  Wheezing - Plan: albuterol (VENTOLIN HFA) 108 (90 Base) MCG/ACT inhaler, budesonide-formoterol (SYMBICORT) 80-4.5 MCG/ACT inhaler, Ambulatory referral  to Pulmonology, cetirizine (ZYRTEC) 10 MG tablet, DG Chest 2 View  Problem List Items Addressed This Visit      Other   Wheezing - Primary    I was able to see patient initially over video, did not appear to have any acute respiratory distress.  Over the phone for the duration of our visit, he was not labored in speech.  Wheezing, shortness of breath comes and goes. No CP.   He does have a history of childhood asthma.  We jointly agreed trial of  antihistamine, albuterol for rescue inhaler and Symbicort for maintenance.  Close follow-up.  Pending chest x-ray, consult pulmonology.      Relevant Medications   albuterol (VENTOLIN HFA) 108 (90 Base) MCG/ACT inhaler   budesonide-formoterol (SYMBICORT) 80-4.5 MCG/ACT inhaler   cetirizine (ZYRTEC) 10 MG tablet   Other Relevant Orders   Ambulatory referral to Pulmonology   DG Chest 2 View        I discussed the assessment and treatment plan with the patient. The patient was provided an opportunity to ask questions and all were answered. The patient agreed with the plan and demonstrated an understanding of the instructions.   The patient was advised to call back or seek an in-person evaluation if the symptoms worsen or if the condition fails to improve as anticipated.   [name], FNP   I spent 25 min non face to face w/ pt.

## 2018-06-13 NOTE — Patient Instructions (Addendum)
Use albuterol every 6 hours for first 24 hours to get good medication into the lungs and loosen congestion; after, you may use as needed and eventually stop all together when cough resolves. This is your RESCUE medication.  I have also prescribed Symbicort which is your maintenance inhaler.  Use this inhaler  every day.    Please start Zyrtec as well. Close follow-up in 2 weeks, certainly sooner if you have any concerns or new symptoms.  My nurse, Judson Roch, will call you in regards to scheduling chest x-ray and follow-up   Asthma Attack Prevention, Adult Although you may not be able to control the fact that you have asthma, you can take actions to prevent episodes of asthma (asthma attacks). These actions include:  Creating a written plan for managing and treating your asthma attacks (asthma action plan).  Monitoring your asthma.  Avoiding things that can irritate your airways or make your asthma symptoms worse (asthma triggers).  Taking your medicines as directed.  Acting quickly if you have signs or symptoms of an asthma attack. What are some ways to prevent an asthma attack? Create a plan Work with your health care provider to create an asthma action plan. This plan should include:  A list of your asthma triggers and how to avoid them.  A list of symptoms that you experience during an asthma attack.  Information about when to take medicine and how much medicine to take.  Information to help you understand your peak flow measurements.  Contact information for your health care providers.  Daily actions that you can take to control asthma. Monitor your asthma To monitor your asthma:  Use your peak flow meter every morning and every evening for 2-3 weeks. Record the results in a journal. A drop in your peak flow numbers on one or more days may mean that you are starting to have an asthma attack, even if you are not having symptoms.  When you have asthma symptoms, write them down  in a journal.  Avoid asthma triggers Work with your health care provider to find out what your asthma triggers are. This can be done by:  Being tested for allergies.  Keeping a journal that notes when asthma attacks occur and what may have contributed to them.  Asking your health care provider whether other medical conditions make your asthma worse. Common asthma triggers include:  Dust.  Smoke. This includes campfire smoke and secondhand smoke from tobacco products.  Pet dander.  Trees, grasses or pollens.  Very cold, dry, or humid air.  Mold.  Foods that contain high amounts of sulfites.  Strong smells.  Engine exhaust and air pollution.  Aerosol sprays and fumes from household cleaners.  Household pests and their droppings, including dust mites and cockroaches.  Certain medicines, including NSAIDs. Once you have determined your asthma triggers, take steps to avoid them. Depending on your triggers, you may be able to reduce the chance of an asthma attack by:  Keeping your home clean. Have someone dust and vacuum your home for you 1 or 2 times a week. If possible, have them use a high-efficiency particulate arrestance (HEPA) vacuum.  Washing your sheets weekly in hot water.  Using allergy-proof mattress covers and casings on your bed.  Keeping pets out of your home.  Taking care of mold and water problems in your home.  Avoiding areas where people smoke.  Avoiding using strong perfumes or odor sprays.  Avoid spending a lot of time outdoors when pollen  counts are high and on very windy days.  Talking with your health care provider before stopping or starting any new medicines. Medicines Take over-the-counter and prescription medicines only as told by your health care provider. Many asthma attacks can be prevented by carefully following your medicine schedule. Taking your medicines correctly is especially important when you cannot avoid certain asthma triggers.  Even if you are doing well, do not stop taking your medicine and do not take less medicine. Act quickly If an asthma attack happens, acting quickly can decrease how severe it is and how long it lasts. Take these actions:  Pay attention to your symptoms. If you are coughing, wheezing, or having difficulty breathing, do not wait to see if your symptoms go away on their own. Follow your asthma action plan.  If you have followed your asthma action plan and your symptoms are not improving, call your health care provider or seek immediate medical care at the nearest hospital. It is important to write down how often you need to use your fast-acting rescue inhaler. You can track how often you use an inhaler in your journal. If you are using your rescue inhaler more often, it may mean that your asthma is not under control. Adjusting your asthma treatment plan may help you to prevent future asthma attacks and help you to gain better control of your condition. How can I prevent an asthma attack when I exercise? Exercise is a common asthma trigger. To prevent asthma attacks during exercise:  Follow advice from your health care provider about whether you should use your fast-acting inhaler before exercising. Many people with asthma experience exercise-induced bronchoconstriction (EIB). This condition often worsens during vigorous exercise in cold, humid, or dry environments. Usually, people with EIB can stay very active by using a fast-acting inhaler before exercising.  Avoid exercising outdoors in very cold or humid weather.  Avoid exercising outdoors when pollen counts are high.  Warm up and cool down when exercising.  Stop exercising right away if asthma symptoms start. Consider taking part in exercises that are less likely to cause asthma symptoms such as:  Indoor swimming.  Biking.  Walking.  Hiking.  Playing football. This information is not intended to replace advice given to you by your  health care provider. Make sure you discuss any questions you have with your health care provider. Document Released: 01/31/2009 Document Revised: 10/14/2015 Document Reviewed: 07/30/2015 Elsevier Interactive Patient Education  2019 [employer].

## 2018-06-13 NOTE — Progress Notes (Signed)
Patient scheduled for f/u 06/27/18 & will come in for chest x-ray Monday.

## 2018-06-13 NOTE — Assessment & Plan Note (Signed)
I was able to see patient initially over video, did not appear to have any acute respiratory distress.  Over the phone for the duration of our visit, he was not labored in speech.  Wheezing, shortness of breath comes and goes. No CP.   He does have a history of childhood asthma.  We jointly agreed trial of antihistamine, albuterol for rescue inhaler and Symbicort for maintenance.  Close follow-up.  Pending chest x-ray, consult pulmonology.

## 2018-06-17 ENCOUNTER — Ambulatory Visit (INDEPENDENT_AMBULATORY_CARE_PROVIDER_SITE_OTHER): Payer: BLUE CROSS/BLUE SHIELD | Admitting: Internal Medicine

## 2018-06-17 ENCOUNTER — Other Ambulatory Visit: Payer: Self-pay

## 2018-06-17 DIAGNOSIS — J4550 Severe persistent asthma, uncomplicated: Secondary | ICD-10-CM | POA: Diagnosis not present

## 2018-06-17 NOTE — Patient Instructions (Signed)
You have allergic asthma, likely caused by springtime allergies. - Continue Symbicort 2 puffs twice daily. - Continue antihistamine. - Use albuterol inhaler as needed when you have chest tightness. - Use a mask whenever you go outside.  Your symptoms may improve after springtime is over and has been going to summer/fall.  If that is the case you may stop the medications and stay off of them if you feel okay, however you should restart them if your breathing gets worse again.  You may notice that your symptoms flareup every year around spring, in that case he would need to restart these medications.

## 2018-06-17 NOTE — Progress Notes (Signed)
[facility] Consultation      Virtual Visit via Telephone Note I connected with pt on 06/17/18 at  9:30 AM EDT by telephone and verified that I am speaking with the correct person using two identifiers.   I discussed the limitations, risks, security and privacy concerns of performing an evaluation and management service by telephone and the availability of in person appointments. I also discussed with the patient that there may be a patient responsible charge related to this service. The patient expressed understanding and agreed to proceed. I discussed the assessment and treatment plan with the patient. The patient was provided an opportunity to ask questions and all were answered. The patient agreed with the plan and demonstrated an understanding of the instructions. Please see note below for further detail.    The patient was advised to call back or seek an in-person evaluation if the symptoms worsen or if the condition fails to improve as anticipated.  I provided 22 minutes of non-face-to-face time during this encounter.   Laverle Hobby, MD    Assessment and Plan:  Allergic asthma. - Symptoms typical of allergic asthma which typically come in springtime.  These appear to be improved with current medications.  Known triggers are going outside, likely triggered by pollen. - Patient asked to continue using Symbicort 2 puffs twice daily, he is asked to use albuterol as needed when he has chest tightness, continue antihistamine. - Patient instructed that the symptoms will likely continue through spring and may improve after spring is over, he can try taking himself off the medication and if he does well he can stay off of it, however symptoms are likely to recur every spring, and he should be prepared to restart the medications at that time.   Date: 06/17/2018  MRN# [id number] [place]ont Gavitt [birth year]  Referring Physician: Rande Brunt FNP  Yeudiel Mateo is a 48  y.o. old male seen in consultation for chief complaint of: dyspnea.    HPI:   The patient is a 48 year old male, he has a history of childhood asthma, recently has been having wheezing, shortness of breath, tickly if working too hard at work.   He feels that his asthma has been coming back, last time he had symptoms he was around 48 yo but has been fine since then until recently.  He was seen by his PCP, was started on antihistamine, albuterol, symbicort. He is currently taking 2 puffs twice per day and feels that it helping, he is also taking claritin once daily.  However when he goes outside he gets chest tightness and feels that his chest is "filling with pollen". He has albuterol but is not using it regularly but he has used it and noticed that it helped.  He has a dog at home, in bedroom.  He denies reflux, or sinus drainage.  He has never been tested for allergies.  He does notice that his eyes get itchy and puffy usually around springtime.      PMHX:   Past Medical History:  Diagnosis Date  . Anemia   . Arthritis   . Asthma   . Genital warts   . Hepatitis   . Obesity    Surgical Hx:  Past Surgical History:  Procedure Laterality Date  . ESOPHAGOGASTRODUODENOSCOPY (EGD) WITH PROPOFOL N/A 10/21/2014   Procedure: ESOPHAGOGASTRODUODENOSCOPY (EGD) WITH PROPOFOL;  Surgeon: Josefine Class, MD;  Location: [facility] ENDOSCOPY;  Service: Endoscopy;  Laterality: N/A;  . HIP SURGERY Left  Family Hx:  Family History  Problem Relation Age of Onset  . Diabetes Mother   . Diabetes Father   . Diabetes Brother   . Cancer Brother   . Diabetes Brother   . Colon cancer Neg Hx    Social Hx:   Social History   Tobacco Use  . Smoking status: Light Tobacco Smoker    Types: Cigars  . Smokeless tobacco: Never Used  Substance Use Topics  . Alcohol use: Yes    Alcohol/week: 0.0 standard drinks    Comment: weekends;   . Drug use: No   Medication:    Current Outpatient Medications:   .  albuterol (VENTOLIN HFA) 108 (90 Base) MCG/ACT inhaler, Inhale 2 puffs into the lungs every 6 (six) hours as needed for wheezing or shortness of breath., Disp: 1 Inhaler, Rfl: 0 .  budesonide-formoterol (SYMBICORT) 80-4.5 MCG/ACT inhaler, Inhale 2 puffs into the lungs 2 (two) times daily., Disp: 1 Inhaler, Rfl: 3 .  cetirizine (ZYRTEC) 10 MG tablet, Take 1 tablet (10 mg total) by mouth daily., Disp: 30 tablet, Rfl: 11 .  cholecalciferol (VITAMIN D3) 25 MCG (1000 UT) tablet, Take 1,000 Units by mouth daily., Disp: , Rfl:    Allergies:  Patient has no known allergies.  Review of Systems: Gen:  Denies  fever, sweats, chills HEENT: Denies blurred vision, double vision. bleeds, sore throat Cvc:  No dizziness, chest pain. Resp:   Denies cough or sputum production, shortness of breath Gi: Denies swallowing difficulty, stomach pain. Gu:  Denies bladder incontinence, burning urine Ext:   No Joint pain, stiffness. Skin: No skin rash,  hives  Endoc:  No polyuria, polydipsia. Psych: No depression, insomnia. Other:  All other systems were reviewed with the patient and were negative other that what is mentioned in the HPI.    LABORATORY PANEL:   CBC No results for input(s): WBC, HGB, HCT, PLT in the last 168 hours. ------------------------------------------------------------------------------------------------------------------  Chemistries  No results for input(s): NA, K, CL, CO2, GLUCOSE, BUN, CREATININE, CALCIUM, MG, AST, ALT, ALKPHOS, BILITOT in the last 168 hours.  Invalid input(s): GFRCGP ------------------------------------------------------------------------------------------------------------------  Cardiac Enzymes No results for input(s): TROPONINI in the last 168 hours. ------------------------------------------------------------  RADIOLOGY:  No results found.     Thank  you for the consultation and for allowing [facility] to assist in the care  of your patient. Our recommendations are noted above.  Please contact us if we can be of further service.   Marda Stalker, M.D., F.C.C.P.  Board Certified in Internal Medicine, Pulmonary Medicine, [place], and Sleep Medicine.  [facility] Office Number: [phone]   06/17/2018

## 2018-06-19 ENCOUNTER — Other Ambulatory Visit: Payer: Self-pay

## 2018-06-19 ENCOUNTER — Ambulatory Visit (INDEPENDENT_AMBULATORY_CARE_PROVIDER_SITE_OTHER): Payer: BLUE CROSS/BLUE SHIELD

## 2018-06-19 DIAGNOSIS — R062 Wheezing: Secondary | ICD-10-CM | POA: Diagnosis not present

## 2018-06-19 DIAGNOSIS — R05 Cough: Secondary | ICD-10-CM | POA: Diagnosis not present

## 2018-06-27 ENCOUNTER — Ambulatory Visit (INDEPENDENT_AMBULATORY_CARE_PROVIDER_SITE_OTHER): Payer: BLUE CROSS/BLUE SHIELD | Admitting: Family

## 2018-06-27 ENCOUNTER — Telehealth: Payer: Self-pay

## 2018-06-27 ENCOUNTER — Encounter: Payer: Self-pay | Admitting: Family

## 2018-06-27 ENCOUNTER — Other Ambulatory Visit: Payer: Self-pay

## 2018-06-27 DIAGNOSIS — R062 Wheezing: Secondary | ICD-10-CM

## 2018-06-27 DIAGNOSIS — R0683 Snoring: Secondary | ICD-10-CM | POA: Insufficient documentation

## 2018-06-27 NOTE — Patient Instructions (Addendum)
Call [facility], as they can be helpful in regards to prescription costs.   [place]  Phone: [phone]   Please let us know if you do not hear from [place] or pulmonology in regards to Dr Ashby Dawes in the formal lung testing, sleep study.  Stay safe!

## 2018-06-27 NOTE — Progress Notes (Signed)
This visit type was conducted due to national recommendations for restrictions regarding the COVID-19 pandemic (e.g. social distancing).  This format is felt to be most appropriate for this patient at this time.  All issues noted in this document were discussed and addressed.  No physical exam was performed (except for noted visual exam findings with Video Visits). Virtual Visit via Video Note  I connected with@  on 06/27/18 at 11:00 AM EDT by a video enabled telemedicine application and verified that I am speaking with the correct person using two identifiers.  Location patient: home Location provider:work Persons participating in the virtual visit: patient, provider  I discussed the limitations of evaluation and management by telemedicine and the availability of in person appointments. The patient expressed understanding and agreed to proceed.   HPI:  Asthma- wheezing has improved on symbicort. Wheezing more so in the morning. NO cough. Clears throat and runny clear congestion 'in the chest' in the morning. On zyrtec at night. No fever, CP.   Dog at home. Smokes cigars.   Would like formal testing.  Feels like has 'sleep apnea'. Snores. Stops breathing at night, awaken him from sleep.  No HA.      ROS: See pertinent positives and negatives per HPI.  Past Medical History:  Diagnosis Date  . Anemia   . Arthritis   . Asthma   . Genital warts   . Hepatitis   . Obesity     Past Surgical History:  Procedure Laterality Date  . ESOPHAGOGASTRODUODENOSCOPY (EGD) WITH PROPOFOL N/A 10/21/2014   Procedure: ESOPHAGOGASTRODUODENOSCOPY (EGD) WITH PROPOFOL;  Surgeon: Josefine Class, MD;  Location: [facility] ENDOSCOPY;  Service: Endoscopy;  Laterality: N/A;  . HIP SURGERY Left     Family History  Problem Relation Age of Onset  . Diabetes Mother   . Diabetes Father   . Diabetes Brother   . Cancer Brother   . Diabetes Brother   . Colon cancer Neg Hx     SOCIAL HX:  smoker   Current Outpatient Medications:  .  albuterol (VENTOLIN HFA) 108 (90 Base) MCG/ACT inhaler, Inhale 2 puffs into the lungs every 6 (six) hours as needed for wheezing or shortness of breath., Disp: 1 Inhaler, Rfl: 0 .  budesonide-formoterol (SYMBICORT) 80-4.5 MCG/ACT inhaler, Inhale 2 puffs into the lungs 2 (two) times daily., Disp: 1 Inhaler, Rfl: 3 .  cetirizine (ZYRTEC) 10 MG tablet, Take 1 tablet (10 mg total) by mouth daily., Disp: 30 tablet, Rfl: 11 .  cholecalciferol (VITAMIN D3) 25 MCG (1000 UT) tablet, Take 1,000 Units by mouth daily., Disp: , Rfl:   EXAM:  VITALS per patient if applicable:  GENERAL: alert, oriented, appears well and in no acute distress  HEENT: atraumatic, conjunttiva clear, no obvious abnormalities on inspection of external nose and ears  NECK: normal movements of the head and neck  LUNGS: on inspection no signs of respiratory distress, breathing rate appears normal, no obvious gross SOB, gasping or wheezing  CV: no obvious cyanosis  MS: moves all visible extremities without noticeable abnormality  PSYCH/NEURO: pleasant and cooperative, no obvious depression or anxiety, speech and thought processing grossly intact  ASSESSMENT AND PLAN:  Discussed the following assessment and plan:  Wheezing  Snores  Problem List Items Addressed This Visit      Other   Wheezing    Improved.  No acute respiratory distress or coughing appreciate on video today. suspect asthma however patient does smoke cigars. Have called Dr Mathis Fare office and asked for  formal lung testing to differentiate.  Patient declines increasing Symbicort or augmenting with something like a Singulair or Flonase at this time.  Patient endorses financial limitations.  he will let me know if he changes his mind, particularly regards to changes in symptoms.      Snores    Advised formal sleep evaluation with pulmonology, with whom patient is established with.  Sarah ( cma) has  called over to pulmonology at this time.           I discussed the assessment and treatment plan with the patient. The patient was provided an opportunity to ask questions and all were answered. The patient agreed with the plan and demonstrated an understanding of the instructions.   The patient was advised to call back or seek an in-person evaluation if the symptoms worsen or if the condition fails to improve as anticipated.   [name], FNP

## 2018-06-27 NOTE — Progress Notes (Signed)
I spoke with Seth Simmons at [facility] & she was going to call patient to get him scheduled. She stated that she start doing appointments for PFT's again in June. Patient can discuss sleep study with Dr. Ashby Dawes.

## 2018-06-27 NOTE — Telephone Encounter (Signed)
Received call from [place] with Dr. Vidal Schwalbe, they just finished virtual visit with this patient and he mentioned he forgot to ask if he could be formally tested for Asthma. I will call patient to schedule.   Patient scheduled for June 5th. Nothing further needed at this time.

## 2018-06-27 NOTE — Assessment & Plan Note (Signed)
Advised formal sleep evaluation with pulmonology, with whom patient is established with.  Sarah ( cma) has called over to pulmonology at this time.

## 2018-06-27 NOTE — Assessment & Plan Note (Addendum)
Improved.  No acute respiratory distress or coughing appreciate on video today. suspect asthma however patient does smoke cigars. Have called Dr Mathis Fare office and asked for formal lung testing to differentiate.  Patient declines increasing Symbicort or augmenting with something like a Singulair or Flonase at this time.  Patient endorses financial limitations.  he will let me know if he changes his mind, particularly regards to changes in symptoms.

## 2018-06-30 ENCOUNTER — Telehealth: Payer: Self-pay

## 2018-06-30 NOTE — Telephone Encounter (Signed)
Patient returned phone call. Patient was thankful for the copay card and was counseled to seek out additional copay cards if his inhaler changes upon results of PFTs in June.   Patient reports he is doing well on Symbicort, using it as prescribe twice daily. He does report some tightness with exertion that resolves on rest. He believes this is due to allergies impacting his asthma. He denies use of cigars for the past few months and says when he does use cigars it is very sparingly.   Patient was counseled on when to seek help, like shortness of breath that does not resolve with rest or worsening shortness of breath. Patient also counseled to be cautious and if he suspects COVID-19 to seek care. Patient voiced understanding.  Azzie Roup D PGY1 Pharmacy Resident  Phone [phone] Please use [facility]ION for clinical pharmacists numbers  06/30/2018      10:09 AM

## 2018-06-30 NOTE — Telephone Encounter (Signed)
Called Mr. Zellars to let him know I called a copay card to [facility] for his Symbicort. It will be available for refill again in July for a zero dollar copay. There was no answer and a HIPAA compliant voice mail was left.   I asked patient to call me back to relay this information.   Of note patient is scheduled for PFTs in June. If the results of the PFTs change his inhaler prescription, he has Pharmacist, community and should be able to use a coupon copay card for nearly any inhaler.  Thanks,  Azzie Roup D PGY1 Pharmacy Resident  Phone [phone] Please use AMION for clinical pharmacists numbers  06/30/2018      9:36 AM

## 2018-07-07 ENCOUNTER — Other Ambulatory Visit: Payer: Self-pay | Admitting: Family

## 2018-07-07 DIAGNOSIS — R062 Wheezing: Secondary | ICD-10-CM

## 2018-08-01 ENCOUNTER — Ambulatory Visit: Payer: BLUE CROSS/BLUE SHIELD | Admitting: Internal Medicine

## 2018-08-05 NOTE — Progress Notes (Signed)
[facility]      Assessment and Plan:  Allergic asthma. - Symptoms typical of allergic asthma which typically come in springtime.  These appear to be improved with current medications.  Known triggers are going outside, likely triggered by pollen.  Continues to have symptoms. - Patient asked to continue using Symbicort 2 puffs twice daily, he is asked to use albuterol as needed when he has chest tightness, continue antihistamine. - Asked to restart Singulair every night.  Remove pets from bedroom.  Excessive daytime sleepiness. - Symptoms and signs of obstructive sleep apnea. - We will send for sleep study.   Date: 08/05/2018  MRN# [id number] Seth Simmons 02-07-1971  Referring Physician: Rande Brunt FNP  Seth Simmons is a 48 y.o. old male seen in consultation for chief complaint of: dyspnea.    HPI:  Seth Simmons is a 48 y.o. male with a history of childhood asthma, recently has been having wheezing, shortness of breath, tickly if working too hard at work.  At last visit it was noted that the patient had likely allergic asthma, typically more in the springtime.  He was asked to continue Symbicort, antihistamine, and take note of possible triggers. Since his last visit he was noted that his breathing worsens when he turns on the air in the car, or with a lot of walking. Also worse when pollen is high.  He also noted daytime sleepiness with snoring and witnessed apneas. He takes symbicort 2 puffs twice per day, singulair as needed (about 4 times per week). He is using ventolin 5 days in past week, he takes zyrtec daily. He has lost 50 pounds in the past year. His mother and 2 brothers have OSA.   He has a dog at home, in bedroom.  He denies reflux, or sinus drainage.  He has never been tested for allergies.  He does notice that his eyes get itchy and puffy usually around springtime.    **CBC 10/11/2017>> AEC 100.  Medication:    Current Outpatient Medications:   .  albuterol (VENTOLIN HFA) 108 (90 Base) MCG/ACT inhaler, TAKE 2 PUFFS BY MOUTH EVERY 6 HOURS AS NEEDED FOR WHEEZE OR SHORTNESS OF BREATH, Disp: 6.7 Inhaler, Rfl: 0 .  budesonide-formoterol (SYMBICORT) 80-4.5 MCG/ACT inhaler, Inhale 2 puffs into the lungs 2 (two) times daily., Disp: 1 Inhaler, Rfl: 3 .  cetirizine (ZYRTEC) 10 MG tablet, Take 1 tablet (10 mg total) by mouth daily., Disp: 30 tablet, Rfl: 11 .  cholecalciferol (VITAMIN D3) 25 MCG (1000 UT) tablet, Take 1,000 Units by mouth daily., Disp: , Rfl:    Allergies:  Patient has no known allergies.  Review of Systems:  Constitutional: Feels well. Cardiovascular: Denies chest pain, exertional chest pain.  Pulmonary: Denies hemoptysis, pleuritic chest pain.   The remainder of systems were reviewed and were found to be negative other than what is documented in the HPI.    Physical Examination:   VS: BP 118/76 (BP Location: Left Arm, Cuff Size: Normal)   Pulse 66   Temp 97.7 F (36.5 C) (Temporal)   Ht 6\' 4"  (1.93 m)   Wt (!) 310 lb (140.6 kg)   SpO2 98%   BMI 37.73 kg/m   General Appearance: No distress  Neuro:without focal findings, mental status, speech normal, alert and oriented HEENT: PERRLA, EOM intact Pulmonary: No wheezing, No rales  CardiovascularNormal S1,S2.  No m/r/g.  Abdomen: Benign, Soft, non-tender, No masses Renal:  No costovertebral tenderness  GU:  No performed at  this time. Endoc: No evident thyromegaly, no signs of acromegaly or Cushing features Skin:   warm, no rashes, no ecchymosis  Extremities: normal, no cyanosis, clubbing.    LABORATORY PANEL:   CBC No results for input(s): WBC, HGB, HCT, PLT in the last 168 hours. ------------------------------------------------------------------------------------------------------------------  Chemistries  No results for input(s): NA, K, CL, CO2, GLUCOSE, BUN, CREATININE, CALCIUM, MG, AST, ALT, ALKPHOS, BILITOT in the last 168 hours.  Invalid input(s):  GFRCGP ------------------------------------------------------------------------------------------------------------------  Cardiac Enzymes No results for input(s): TROPONINI in the last 168 hours. ------------------------------------------------------------  RADIOLOGY:  No results found.     Thank  you for the consultation and for allowing [facility] to assist in the care of your patient. Our recommendations are noted above.  Please contact us if we can be of further service.   Marda Stalker, M.D., F.C.C.P.  Board Certified in Internal Medicine, Pulmonary Medicine, [place], and Sleep Medicine.  [facility] Office Number: [phone]   08/05/2018

## 2018-08-06 ENCOUNTER — Encounter: Payer: Self-pay | Admitting: Internal Medicine

## 2018-08-06 ENCOUNTER — Other Ambulatory Visit: Payer: Self-pay

## 2018-08-06 ENCOUNTER — Ambulatory Visit: Payer: BC Managed Care – PPO | Admitting: Internal Medicine

## 2018-08-06 VITALS — BP 118/76 | HR 66 | Temp 97.7°F | Ht 76.0 in | Wt 310.0 lb

## 2018-08-06 DIAGNOSIS — J4550 Severe persistent asthma, uncomplicated: Secondary | ICD-10-CM | POA: Diagnosis not present

## 2018-08-06 DIAGNOSIS — G4719 Other hypersomnia: Secondary | ICD-10-CM | POA: Diagnosis not present

## 2018-08-06 NOTE — Patient Instructions (Addendum)
Start taking singulair every night.  Will send for sleep study.  Keep pets out of bedroom.   Sleep Apnea    Sleep apnea is disorder that affects a person's sleep. A person with sleep apnea has abnormal pauses in their breathing when they sleep. It is hard for them to get a good sleep. This makes a person tired during the day. It also can lead to other physical problems. There are three types of sleep apnea. One type is when breathing stops for a short time because your airway is blocked (obstructive sleep apnea). Another type is when the brain sometimes fails to give the normal signal to breathe to the muscles that control your breathing (central sleep apnea). The third type is a combination of the other two types.  HOME CARE   Take all medicine as told by your doctor.  Avoid alcohol, calming medicines (sedatives), and depressant drugs.  Try to lose weight if you are overweight. Talk to your doctor about a healthy weight goal.  Your doctor may have you use a device that helps to open your airway. It can help you get the air that you need. It is called a positive airway pressure (PAP) device.   MAKE SURE YOU:   Understand these instructions.  Will watch your condition.  Will get help right away if you are not doing well or get worse.  It may take approximately 1 month for you to get used to wearing her CPAP every night.  Be sure to work with your machine to get used to it, be patient, it may take time!  If you have trouble tolerating CPAP DO NOT RETURN YOUR MACHINE; Contact our office to see if we can help you tolerate the CPAP better first!

## 2018-08-12 DIAGNOSIS — G4733 Obstructive sleep apnea (adult) (pediatric): Secondary | ICD-10-CM | POA: Diagnosis not present

## 2018-08-15 ENCOUNTER — Ambulatory Visit: Payer: BC Managed Care – PPO

## 2018-08-15 ENCOUNTER — Other Ambulatory Visit: Payer: Self-pay

## 2018-08-15 DIAGNOSIS — G4719 Other hypersomnia: Secondary | ICD-10-CM

## 2018-08-20 ENCOUNTER — Ambulatory Visit: Payer: BLUE CROSS/BLUE SHIELD | Admitting: Family

## 2018-08-20 DIAGNOSIS — G4733 Obstructive sleep apnea (adult) (pediatric): Secondary | ICD-10-CM | POA: Diagnosis not present

## 2018-08-21 ENCOUNTER — Telehealth: Payer: Self-pay

## 2018-08-21 DIAGNOSIS — G4733 Obstructive sleep apnea (adult) (pediatric): Secondary | ICD-10-CM

## 2018-08-21 NOTE — Telephone Encounter (Signed)
Patient called to discuss sleep study results.   Moderate Sleep Apnea with AHI of 29  Recommend auto-CPAP with a pressure range of 5-20 cm H2O    Pt wishes to proceed order placed. Pt was also  advised  he will need to be seen within 31-90 days after startup and to call for appt.

## 2018-09-06 ENCOUNTER — Other Ambulatory Visit: Payer: Self-pay | Admitting: Family

## 2018-09-06 DIAGNOSIS — R062 Wheezing: Secondary | ICD-10-CM

## 2018-09-17 ENCOUNTER — Other Ambulatory Visit: Payer: Self-pay

## 2018-09-17 ENCOUNTER — Ambulatory Visit (INDEPENDENT_AMBULATORY_CARE_PROVIDER_SITE_OTHER): Payer: BC Managed Care – PPO | Admitting: Family

## 2018-09-17 ENCOUNTER — Encounter: Payer: Self-pay | Admitting: Family

## 2018-09-17 DIAGNOSIS — R509 Fever, unspecified: Secondary | ICD-10-CM

## 2018-09-17 DIAGNOSIS — Z20822 Contact with and (suspected) exposure to covid-19: Secondary | ICD-10-CM

## 2018-09-17 NOTE — Assessment & Plan Note (Addendum)
Suspect viral illness. No acute respiratory distress and patient feels safe to be home.  Pending COVID test. Advised ibuprofen prn if needed. Education provided on AVS. Work note provided.  Patient will let me know how he is doing.

## 2018-09-17 NOTE — Progress Notes (Signed)
This visit type was conducted due to national recommendations for restrictions regarding the COVID-19 pandemic (e.g. social distancing).  This format is felt to be most appropriate for this patient at this time.  All issues noted in this document were discussed and addressed.  No physical exam was performed (except for noted visual exam findings with Video Visits). Virtual Visit via Video Note  I connected with@  on 09/17/18 at  8:00 AM EDT by a video enabled telemedicine application and verified that I am speaking with the correct person using two identifiers.  Location patient: home Location provider:work or home office Persons participating in the virtual visit: patient, provider  I discussed the limitations of evaluation and management by telemedicine and the availability of in person appointments. The patient expressed understanding and agreed to proceed.   HPI:  CC: fever x 3 days, waxing and waning.   Home today with fiance. She has no symptoms.   Tmax 101.1; today fever 99.72F.   Endorses bodyaches, one non bloody emesis episode.   Occasional productive cough. No 'real cough' NO wheezing, sob, sinus pressure, ear pain, abdominal pain, dysuria, HA, vision changes.   Taking benadryl and alselzter cold plus.   Went to [facility] over the weekend  Works in Medical transport   ROS: See pertinent positives and negatives per HPI.  Past Medical History:  Diagnosis Date  . Anemia   . Arthritis   . Asthma   . Genital warts   . Hepatitis   . Obesity     Past Surgical History:  Procedure Laterality Date  . ESOPHAGOGASTRODUODENOSCOPY (EGD) WITH PROPOFOL N/A 10/21/2014   Procedure: ESOPHAGOGASTRODUODENOSCOPY (EGD) WITH PROPOFOL;  Surgeon: Josefine Class, MD;  Location: [facility] ENDOSCOPY;  Service: Endoscopy;  Laterality: N/A;  . HIP SURGERY Left     Family History  Problem Relation Age of Onset  . Diabetes Mother   . Diabetes Father   . Diabetes Brother   . Cancer Brother    . Diabetes Brother   . Colon cancer Neg Hx     SOCIAL HX: smoker   Current Outpatient Medications:  .  albuterol (VENTOLIN HFA) 108 (90 Base) MCG/ACT inhaler, TAKE 2 PUFFS BY MOUTH EVERY 6 HOURS AS NEEDED FOR WHEEZE OR SHORTNESS OF BREATH, Disp: 6.7 Inhaler, Rfl: 0 .  cetirizine (ZYRTEC) 10 MG tablet, Take 1 tablet (10 mg total) by mouth daily., Disp: 30 tablet, Rfl: 11 .  cholecalciferol (VITAMIN D3) 25 MCG (1000 UT) tablet, Take 1,000 Units by mouth daily., Disp: , Rfl:  .  SYMBICORT 80-4.5 MCG/ACT inhaler, TAKE 2 PUFFS BY MOUTH TWICE A DAY, Disp: 30.6 Inhaler, Rfl: 1  EXAM:  VITALS per patient if applicable:  GENERAL: alert, oriented, appears well and in no acute distress  HEENT: atraumatic, conjunttiva clear, no obvious abnormalities on inspection of external nose and ears  NECK: normal movements of the head and neck  LUNGS: on inspection no signs of respiratory distress, breathing rate appears normal, no obvious gross SOB, gasping or wheezing  CV: no obvious cyanosis  MS: moves all visible extremities without noticeable abnormality  PSYCH/NEURO: pleasant and cooperative, no obvious depression or anxiety, speech and thought processing grossly intact  ASSESSMENT AND PLAN:  Discussed the following assessment and plan:  Problem List Items Addressed This Visit      Other   Fever - Primary    Suspect viral illness. No acute respiratory distress and patient feels safe to be home.  Pending COVID test. Advised ibuprofen prn  if needed. Education provided on AVS. Work note provided.  Patient will let me know how he is doing.       Relevant Orders   Novel Coronavirus, NAA ([facility])        I discussed the assessment and treatment plan with the patient. The patient was provided an opportunity to ask questions and all were answered. The patient agreed with the plan and demonstrated an understanding of the instructions.   The patient was advised to call back or seek an  in-person evaluation if the symptoms worsen or if the condition fails to improve as anticipated.   [name], FNP

## 2018-09-17 NOTE — Patient Instructions (Addendum)
COVID test ordered; please go anytime today between 8am and 3:30. You have to be in line by 3:30 to be tested as site closes at 4pm. They are open Monday through Friday.   Please go to  [facility] will see a tent; please let me know if you have any questions.   Remember:  Please stay home and quarantine.  You may not return to work until at least 7 days since symptom onset and AT LEAST 3 days of NO symptoms/complete resolution prior to returning to work.   Stay safe.     Person Under Monitoring Name: Seth Simmons  Location: [place] [address]   Infection Prevention Recommendations for Individuals Confirmed to have, or Being Evaluated for, 2019 Novel Coronavirus (COVID-19) Infection Who Receive Care at Home  Individuals who are confirmed to have, or are being evaluated for, COVID-19 should follow the prevention steps below until a healthcare provider or local or state health department says they can return to normal activities.  Stay home except to get medical care You should restrict activities outside your home, except for getting medical care. Do not go to work, school, or public areas, and do not use public transportation or taxis.  Call ahead before visiting your doctor Before your medical appointment, call the healthcare provider and tell them that you have, or are being evaluated for, COVID-19 infection. This will help the healthcare provider's office take steps to keep other people from getting infected. Ask your healthcare provider to call the local or state health department.  Monitor your symptoms Seek prompt medical attention if your illness is worsening (e.g., difficulty breathing). Before going to your medical appointment, call the healthcare provider and tell them that you have, or are being evaluated for, COVID-19 infection. Ask your healthcare provider  to call the local or state health department.  Wear a facemask You should wear a facemask that covers your nose and mouth when you are in the same room with other people and when you visit a healthcare provider. People who live with or visit you should also wear a facemask while they are in the same room with you.  Separate yourself from other people in your home As much as possible, you should stay in a different room from other people in your home. Also, you should use a separate bathroom, if available.  Avoid sharing household items You should not share dishes, drinking glasses, cups, eating utensils, towels, bedding, or other items with other people in your home. After using these items, you should wash them thoroughly with soap and water.  Cover your coughs and sneezes Cover your mouth and nose with a tissue when you cough or sneeze, or you can cough or sneeze into your sleeve. Throw used tissues in a lined trash can, and immediately wash your hands with soap and water for at least 20 seconds or use an alcohol-based hand rub.  Wash your [facility] your hands often and thoroughly with soap and water for at least 20 seconds. You can use an alcohol-based hand sanitizer if soap and water are not available and if your hands are not visibly dirty. Avoid touching your eyes, nose, and mouth with unwashed hands.   Prevention Steps for Caregivers and Household Members of Individuals Confirmed to have, or Being Evaluated for, COVID-19 Infection Being Cared for in the Home  If you live with,  or provide care at home for, a person confirmed to have, or being evaluated for, COVID-19 infection please follow these guidelines to prevent infection:  Follow healthcare provider's instructions Make sure that you understand and can help the patient follow any healthcare provider instructions for all care.  Provide for the patient's basic needs You should help the patient with basic needs in the  home and provide support for getting groceries, prescriptions, and other personal needs.  Monitor the patient's symptoms If they are getting sicker, call his or her medical provider and tell them that the patient has, or is being evaluated for, COVID-19 infection. This will help the healthcare provider's office take steps to keep other people from getting infected. Ask the healthcare provider to call the local or state health department.  Limit the number of people who have contact with the patient  If possible, have only one caregiver for the patient.  Other household members should stay in another home or place of residence. If this is not possible, they should stay  in another room, or be separated from the patient as much as possible. Use a separate bathroom, if available.  Restrict visitors who do not have an essential need to be in the home.  Keep older adults, very young children, and other sick people away from the patient Keep older adults, very young children, and those who have compromised immune systems or chronic health conditions away from the patient. This includes people with chronic heart, lung, or kidney conditions, diabetes, and cancer.  Ensure good ventilation Make sure that shared spaces in the home have good air flow, such as from an air conditioner or an opened window, weather permitting.  Wash your hands often  Wash your hands often and thoroughly with soap and water for at least 20 seconds. You can use an alcohol based hand sanitizer if soap and water are not available and if your hands are not visibly dirty.  Avoid touching your eyes, nose, and mouth with unwashed hands.  Use disposable paper towels to dry your hands. If not available, use dedicated cloth towels and replace them when they become wet.  Wear a facemask and gloves  Wear a disposable facemask at all times in the room and gloves when you touch or have contact with the patient's blood, body  fluids, and/or secretions or excretions, such as sweat, saliva, sputum, nasal mucus, vomit, urine, or feces.  Ensure the mask fits over your nose and mouth tightly, and do not touch it during use.  Throw out disposable facemasks and gloves after using them. Do not reuse.  Wash your hands immediately after removing your facemask and gloves.  If your personal clothing becomes contaminated, carefully remove clothing and launder. Wash your hands after handling contaminated clothing.  Place all used disposable facemasks, gloves, and other waste in a lined container before disposing them with other household waste.  Remove gloves and wash your hands immediately after handling these items.  Do not share dishes, glasses, or other household items with the patient  Avoid sharing household items. You should not share dishes, drinking glasses, cups, eating utensils, towels, bedding, or other items with a patient who is confirmed to have, or being evaluated for, COVID-19 infection.  After the person uses these items, you should wash them thoroughly with soap and water.  Wash laundry thoroughly  Immediately remove and wash clothes or bedding that have blood, body fluids, and/or secretions or excretions, such as sweat, saliva, sputum, nasal mucus,  vomit, urine, or feces, on them.  Wear gloves when handling laundry from the patient.  Read and follow directions on labels of laundry or clothing items and detergent. In general, wash and dry with the warmest temperatures recommended on the label.  Clean all areas the individual has used often  Clean all touchable surfaces, such as counters, tabletops, doorknobs, bathroom fixtures, toilets, phones, keyboards, tablets, and bedside tables, every day. Also, clean any surfaces that may have blood, body fluids, and/or secretions or excretions on them.  Wear gloves when cleaning surfaces the patient has come in contact with.  Use a diluted bleach solution  (e.g., dilute bleach with 1 part bleach and 10 parts water) or a household disinfectant with a label that says EPA-registered for coronaviruses. To make a bleach solution at home, add 1 tablespoon of bleach to 1 quart (4 cups) of water. For a larger supply, add  cup of bleach to 1 gallon (16 cups) of water.  Read labels of cleaning products and follow recommendations provided on product labels. Labels contain instructions for safe and effective use of the cleaning product including precautions you should take when applying the product, such as wearing gloves or eye protection and making sure you have good ventilation during use of the product.  Remove gloves and wash hands immediately after cleaning.  Monitor yourself for signs and symptoms of illness Caregivers and household members are considered close contacts, should monitor their health, and will be asked to limit movement outside of the home to the extent possible. Follow the monitoring steps for close contacts listed on the symptom monitoring form.   ? If you have additional questions, contact your local health department or call the epidemiologist on call at [phone] (available 24/7). ? This guidance is subject to change. For the most up-to-date guidance from [facility], please refer to their website: [email or web address]

## 2018-09-17 NOTE — Progress Notes (Signed)
I have typed & included note so patient could print from his mychart.

## 2018-09-21 ENCOUNTER — Telehealth: Payer: Self-pay | Admitting: Family Medicine

## 2018-09-21 LAB — NOVEL CORONAVIRUS, NAA: SARS-CoV-2, NAA: DETECTED — AB

## 2018-09-21 NOTE — Telephone Encounter (Signed)
On call note: Critical result called to me:  COVID POSITIVE. Called pt and notified him of result. He is improving. Reviewed quarantine recommendations. Signs/symptoms to go to the ED or call 911 fpr were reviewed and pt expressed understanding.

## 2018-09-22 ENCOUNTER — Encounter: Payer: Self-pay | Admitting: *Deleted

## 2018-09-24 ENCOUNTER — Other Ambulatory Visit: Payer: Self-pay

## 2018-09-24 ENCOUNTER — Encounter: Payer: Self-pay | Admitting: Family

## 2018-09-24 ENCOUNTER — Ambulatory Visit (INDEPENDENT_AMBULATORY_CARE_PROVIDER_SITE_OTHER): Payer: BC Managed Care – PPO | Admitting: Family

## 2018-09-24 DIAGNOSIS — K7400 Hepatic fibrosis, unspecified: Secondary | ICD-10-CM | POA: Insufficient documentation

## 2018-09-24 DIAGNOSIS — G4733 Obstructive sleep apnea (adult) (pediatric): Secondary | ICD-10-CM | POA: Diagnosis not present

## 2018-09-24 NOTE — Assessment & Plan Note (Addendum)
Emphasized the importance of a follow-up with GI regarding surveillance of progression.  Patient verbalized understanding of this. Pending labs.

## 2018-09-24 NOTE — Progress Notes (Signed)
This visit type was conducted due to national recommendations for restrictions regarding the COVID-19 pandemic (e.g. social distancing).  This format is felt to be most appropriate for this patient at this time.  All issues noted in this document were discussed and addressed.  No physical exam was performed (except for noted visual exam findings with Video Visits). Virtual Visit via Video Note  I connected with@  on 09/24/18 at  9:00 AM EDT by a video enabled telemedicine application and verified that I am speaking with the correct person using two identifiers.  Location patient: home Location provider:work Persons participating in the virtual visit: patient, provider  I discussed the limitations of evaluation and management by telemedicine and the availability of in person appointments. The patient expressed understanding and agreed to proceed.   HPI:  Feels well.  No complaints or concerns today.  States his fiance was also COVID positive and they have both been staying home, quarantined.   plans to return to work in 5 days from now.   No fever.   NO cough, wheezing.  OSA- advised needs a Cipap. Has appointment to pick up Cipap.   wheezing- on symbicort daily. Feels like a good regimen.   ROS: See pertinent positives and negatives per HPI.  Past Medical History:  Diagnosis Date  . Anemia   . Arthritis   . Asthma   . Genital warts   . Hepatitis   . Obesity     Past Surgical History:  Procedure Laterality Date  . ESOPHAGOGASTRODUODENOSCOPY (EGD) WITH PROPOFOL N/A 10/21/2014   Procedure: ESOPHAGOGASTRODUODENOSCOPY (EGD) WITH PROPOFOL;  Surgeon: Josefine Class, MD;  Location: [facility] ENDOSCOPY;  Service: Endoscopy;  Laterality: N/A;  . HIP SURGERY Left     Family History  Problem Relation Age of Onset  . Diabetes Mother   . Diabetes Father   . Diabetes Brother   . Cancer Brother   . Diabetes Brother   . Colon cancer Neg Hx     SOCIAL HX: smoker   Current  Outpatient Medications:  .  albuterol (VENTOLIN HFA) 108 (90 Base) MCG/ACT inhaler, TAKE 2 PUFFS BY MOUTH EVERY 6 HOURS AS NEEDED FOR WHEEZE OR SHORTNESS OF BREATH, Disp: 6.7 Inhaler, Rfl: 0 .  cetirizine (ZYRTEC) 10 MG tablet, Take 1 tablet (10 mg total) by mouth daily., Disp: 30 tablet, Rfl: 11 .  cholecalciferol (VITAMIN D3) 25 MCG (1000 UT) tablet, Take 1,000 Units by mouth daily., Disp: , Rfl:  .  SYMBICORT 80-4.5 MCG/ACT inhaler, TAKE 2 PUFFS BY MOUTH TWICE A DAY, Disp: 30.6 Inhaler, Rfl: 1  EXAM:  VITALS per patient if applicable:  GENERAL: alert, oriented, appears well and in no acute distress  HEENT: atraumatic, conjunttiva clear, no obvious abnormalities on inspection of external nose and ears  NECK: normal movements of the head and neck  LUNGS: on inspection no signs of respiratory distress, breathing rate appears normal, no obvious gross SOB, gasping or wheezing  CV: no obvious cyanosis  MS: moves all visible extremities without noticeable abnormality  PSYCH/NEURO: pleasant and cooperative, no obvious depression or anxiety, speech and thought processing grossly intact  ASSESSMENT AND PLAN:  Discussed the following assessment and plan:  Problem List Items Addressed This Visit      Respiratory   OSA (obstructive sleep apnea)    Appointment to pick up CPAP.  Will follow        Digestive   Liver fibrosis - Primary    Emphasized the importance of a follow-up  with GI regarding surveillance of progression.  Patient verbalized understanding of this. Pending labs.       Relevant Orders   CBC with Differential/Platelet   Comprehensive metabolic panel   Protime-INR   AFP tumor marker        I discussed the assessment and treatment plan with the patient. The patient was provided an opportunity to ask questions and all were answered. The patient agreed with the plan and demonstrated an understanding of the instructions.   The patient was advised to call back or seek  an in-person evaluation if the symptoms worsen or if the condition fails to improve as anticipated.   [name], FNP

## 2018-09-24 NOTE — Patient Instructions (Signed)
Please ensure you continue to follow up with Dr Vira Agar ( Gastroenterology) for liver fibrosis to monitor labs as well as annual MRI and Ultrasound of liver.   Please ensure you have an appointment with him scheduled for early 2021.  Let me know if you need anything else.   Stay safe.

## 2018-09-24 NOTE — Assessment & Plan Note (Signed)
Appointment to pick up CPAP.  Will follow

## 2018-09-24 NOTE — Progress Notes (Signed)
Labs have been scheduled for 10/29/18 unless patient presents symptoms of COVID-19.

## 2018-10-01 ENCOUNTER — Ambulatory Visit: Payer: BLUE CROSS/BLUE SHIELD | Admitting: Family

## 2018-10-01 DIAGNOSIS — G4733 Obstructive sleep apnea (adult) (pediatric): Secondary | ICD-10-CM | POA: Diagnosis not present

## 2018-10-29 ENCOUNTER — Other Ambulatory Visit: Payer: Self-pay

## 2018-11-01 DIAGNOSIS — G4733 Obstructive sleep apnea (adult) (pediatric): Secondary | ICD-10-CM | POA: Diagnosis not present

## 2018-11-27 ENCOUNTER — Other Ambulatory Visit: Payer: Self-pay | Admitting: Student

## 2018-11-27 DIAGNOSIS — K7400 Hepatic fibrosis, unspecified: Secondary | ICD-10-CM

## 2018-11-27 DIAGNOSIS — Z8619 Personal history of other infectious and parasitic diseases: Secondary | ICD-10-CM

## 2018-12-01 DIAGNOSIS — G4733 Obstructive sleep apnea (adult) (pediatric): Secondary | ICD-10-CM | POA: Diagnosis not present

## 2018-12-03 ENCOUNTER — Ambulatory Visit
Admission: RE | Admit: 2018-12-03 | Discharge: 2018-12-03 | Disposition: A | Discharge status: Home / Self Care | Payer: Commercial Managed Care - PPO | Source: Ambulatory Visit | Attending: Student | Admitting: Student

## 2018-12-03 ENCOUNTER — Other Ambulatory Visit: Payer: Self-pay

## 2018-12-03 DIAGNOSIS — D1803 Hemangioma of intra-abdominal structures: Secondary | ICD-10-CM | POA: Diagnosis not present

## 2018-12-03 DIAGNOSIS — K760 Fatty (change of) liver, not elsewhere classified: Secondary | ICD-10-CM | POA: Diagnosis not present

## 2018-12-03 DIAGNOSIS — Z8619 Personal history of other infectious and parasitic diseases: Secondary | ICD-10-CM | POA: Diagnosis not present

## 2018-12-03 DIAGNOSIS — K7400 Hepatic fibrosis, unspecified: Secondary | ICD-10-CM | POA: Insufficient documentation

## 2019-01-01 DIAGNOSIS — G4733 Obstructive sleep apnea (adult) (pediatric): Secondary | ICD-10-CM | POA: Diagnosis not present

## 2019-01-31 DIAGNOSIS — G4733 Obstructive sleep apnea (adult) (pediatric): Secondary | ICD-10-CM | POA: Diagnosis not present

## 2019-03-03 DIAGNOSIS — G4733 Obstructive sleep apnea (adult) (pediatric): Secondary | ICD-10-CM | POA: Diagnosis not present

## 2019-03-04 ENCOUNTER — Encounter: Payer: Commercial Managed Care - PPO | Admitting: Family

## 2019-04-03 DIAGNOSIS — G4733 Obstructive sleep apnea (adult) (pediatric): Secondary | ICD-10-CM | POA: Diagnosis not present

## 2019-05-01 DIAGNOSIS — G4733 Obstructive sleep apnea (adult) (pediatric): Secondary | ICD-10-CM | POA: Diagnosis not present

## 2019-05-15 ENCOUNTER — Encounter: Payer: Commercial Managed Care - PPO | Admitting: Family

## 2019-06-10 ENCOUNTER — Encounter: Payer: Self-pay | Admitting: Family

## 2019-08-05 ENCOUNTER — Encounter: Payer: Self-pay | Admitting: Family

## 2019-11-19 ENCOUNTER — Other Ambulatory Visit: Payer: Self-pay

## 2019-11-19 ENCOUNTER — Ambulatory Visit
Admission: EM | Admit: 2019-11-19 | Discharge: 2019-11-19 | Disposition: A | Discharge status: Home / Self Care | Payer: Self-pay | Attending: Family Medicine | Admitting: Family Medicine

## 2019-11-19 ENCOUNTER — Telehealth: Payer: Self-pay | Admitting: Internal Medicine

## 2019-11-19 DIAGNOSIS — R369 Urethral discharge, unspecified: Secondary | ICD-10-CM

## 2019-11-19 DIAGNOSIS — N342 Other urethritis: Secondary | ICD-10-CM

## 2019-11-19 DIAGNOSIS — K2100 Gastro-esophageal reflux disease with esophagitis, without bleeding: Secondary | ICD-10-CM

## 2019-11-19 LAB — POCT URINALYSIS DIP (MANUAL ENTRY)
Bilirubin, UA: NEGATIVE
Blood, UA: NEGATIVE
Glucose, UA: NEGATIVE mg/dL
Ketones, POC UA: NEGATIVE mg/dL
Leukocytes, UA: NEGATIVE
Nitrite, UA: NEGATIVE
Protein Ur, POC: NEGATIVE mg/dL
Spec Grav, UA: 1.025 (ref 1.010–1.025)
Urobilinogen, UA: 0.2 E.U./dL
pH, UA: 6.5 (ref 5.0–8.0)

## 2019-11-19 MED ORDER — CEFTRIAXONE SODIUM 500 MG IJ SOLR
500.0000 mg | Freq: Once | INTRAMUSCULAR | Status: AC
  Administered 2019-11-19: 500 mg via INTRAMUSCULAR

## 2019-11-19 MED ORDER — AZITHROMYCIN 500 MG PO TABS: 1000.0000 mg | ORAL_TABLET | Freq: Every day | ORAL | 0 refills | Status: DC

## 2019-11-19 MED ORDER — OMEPRAZOLE 20 MG PO CPDR: 20.0000 mg | DELAYED_RELEASE_CAPSULE | Freq: Every day | ORAL | 0 refills | Status: DC

## 2019-11-19 NOTE — ED Provider Notes (Signed)
[facility]   [id number] 11/19/19 Arrival Time: 6256  CC: ABDOMINAL PAIN  SUBJECTIVE:  Seth Simmons is a 49 y.o. male who presents with complaint of dysuria for the last week. Reports that he had penile discharge earlier in the week. Denies a precipitating event, trauma, close contacts with similar symptoms, recent travel or antibiotic use. Also reports that after eating, he is experiencing reflux. Has not taken OTC medications for this. Denies alleviating or aggravating factors. Denies similar symptoms in the past.    Denies fever, chills, appetite changes, weight changes, nausea, vomiting, chest pain, SOB, diarrhea, constipation, hematochezia, melena, dysuria, difficulty urinating, increased frequency or urgency, flank pain, loss of bowel or bladder function  ROS: As per HPI.  All other pertinent ROS negative.     Past Medical History:  Diagnosis Date  . Anemia   . Arthritis   . Asthma   . Genital warts   . Hepatitis   . Obesity    Past Surgical History:  Procedure Laterality Date  . ESOPHAGOGASTRODUODENOSCOPY (EGD) WITH PROPOFOL N/A 10/21/2014   Procedure: ESOPHAGOGASTRODUODENOSCOPY (EGD) WITH PROPOFOL;  Surgeon: Josefine Class, MD;  Location: [facility] ENDOSCOPY;  Service: Endoscopy;  Laterality: N/A;  . HIP SURGERY Left    No Known Allergies No current facility-administered medications on file prior to encounter.   Current Outpatient Medications on File Prior to Encounter  Medication Sig Dispense Refill  . albuterol (VENTOLIN HFA) 108 (90 Base) MCG/ACT inhaler TAKE 2 PUFFS BY MOUTH EVERY 6 HOURS AS NEEDED FOR WHEEZE OR SHORTNESS OF BREATH 6.7 Inhaler 0  . cetirizine (ZYRTEC) 10 MG tablet Take 1 tablet (10 mg total) by mouth daily. 30 tablet 11  . cholecalciferol (VITAMIN D3) 25 MCG (1000 UT) tablet Take 1,000 Units by mouth daily. (Patient not taking: Reported on 11/19/2019)    . SYMBICORT 80-4.5 MCG/ACT inhaler TAKE 2 PUFFS BY MOUTH TWICE A DAY 30.6 Inhaler 1    Social History   Socioeconomic History  . Marital status: Married    Spouse name: Not on file  . Number of children: 2  . Years of education: Not on file  . Highest education level: Not on file  Occupational History  . Occupation: Engineer, manufacturing    Comment: [employer]  . Occupation: Patient Transporter    Comment: [employer]  Tobacco Use  . Smoking status: Light Tobacco Smoker    Types: Cigars  . Smokeless tobacco: Never Used  . Tobacco comment: maybe 1 cigar every 3 months   Substance and Sexual Activity  . Alcohol use: Yes    Alcohol/week: 0.0 standard drinks    Comment: weekends;   . Drug use: No  . Sexual activity: Yes    Partners: Female  Other Topics Concern  . Not on file  Social History Narrative   [place], [employer]      Social Determinants of Health   Financial Resource Strain:   . Difficulty of Paying Living Expenses: Not on file  Food Insecurity:   . Worried About Charity fundraiser in the Last Year: Not on file  . Ran Out of Food in the Last Year: Not on file  Transportation Needs:   . Lack of Transportation (Medical): Not on file  . Lack of Transportation (Non-Medical): Not on file  Physical Activity:   . Days of Exercise per Week: Not on file  . Minutes of Exercise per Session: Not on file  Stress:   .  Feeling of Stress : Not on file  Social Connections:   . Frequency of Communication with Friends and Family: Not on file  . Frequency of Social Gatherings with Friends and Family: Not on file  . Attends Religious Services: Not on file  . Active Member of Clubs or Organizations: Not on file  . Attends Archivist Meetings: Not on file  . Marital Status: Not on file  Intimate Partner Violence:   . Fear of Current or Ex-Partner: Not on file  . Emotionally Abused: Not on file  . Physically Abused: Not on file  . Sexually Abused: Not on file   Family History  Problem Relation Age of  Onset  . Diabetes Mother   . Diabetes Father   . Diabetes Brother   . Cancer Brother   . Diabetes Brother   . Colon cancer Neg Hx      OBJECTIVE:  Vitals:   11/19/19 0939  BP: 113/75  Pulse: 78  Resp: 14  Temp: 98.8 F (37.1 C)  SpO2: 96%    General appearance: Alert; NAD HEENT: NCAT.  Oropharynx clear.  Lungs: clear to auscultation bilaterally without adventitious breath sounds Heart: regular rate and rhythm.  Radial pulses 2+ symmetrical bilaterally Abdomen: soft, non-distended; normal active bowel sounds; non-tender to light and deep palpation; nontender at McBurney's point; negative Murphy's sign; negative rebound; no guarding Back: no CVA tenderness Extremities: no edema; symmetrical with no gross deformities Skin: warm and dry Neurologic: normal gait Psychological: alert and cooperative; normal mood and affect  LABS: Results for orders placed or performed during the hospital encounter of 11/19/19 (from the past 24 hour(s))  POCT urinalysis dipstick     Status: None   Collection Time: 11/19/19  9:45 AM  Result Value Ref Range   Color, UA yellow yellow   Clarity, UA clear clear   Glucose, UA negative negative mg/dL   Bilirubin, UA negative negative   Ketones, POC UA negative negative mg/dL   Spec Grav, UA 1.025 1.010 - 1.025   Blood, UA negative negative   pH, UA 6.5 5.0 - 8.0   Protein Ur, POC negative negative mg/dL   Urobilinogen, UA 0.2 0.2 or 1.0 E.U./dL   Nitrite, UA Negative Negative   Leukocytes, UA Negative Negative    DIAGNOSTIC STUDIES: No results found.   ASSESSMENT & PLAN:  1. Gastroesophageal reflux disease with esophagitis without hemorrhage   2. Urethritis   3. Penile discharge     Meds ordered this encounter  Medications  . cefTRIAXone (ROCEPHIN) injection 500 mg  . azithromycin (ZITHROMAX) 500 MG tablet    Sig: Take 2 tablets (1,000 mg total) by mouth daily.    Dispense:  2 tablet    Refill:  0    Order Specific Question:    Supervising Provider    Answer:   Chase Picket [id number]  . omeprazole (PRILOSEC) 20 MG capsule    Sig: Take 1 capsule (20 mg total) by mouth daily.    Dispense:  30 capsule    Refill:  0    Order Specific Question:   Supervising Provider    Answer:   Chase Picket [[id number]]    Will treat for GERD Prescribed omeprazole Will treat for urethritis UA negative for infection Declines current discharge  Follow up with this office or with primary care as needed If you experience new or worsening symptoms return or go to ER such as fever, chills, nausea, vomiting, diarrhea, bloody or dark  tarry stools, constipation, urinary symptoms, worsening abdominal discomfort, symptoms that do not improve with medications, inability to keep fluids down.  Reviewed expectations re: course of current medical issues. Questions answered. Outlined signs and symptoms indicating need for more acute intervention. Patient verbalized understanding. After Visit Summary given.   Faustino Congress, NP 11/19/19 1037

## 2019-11-19 NOTE — Discharge Instructions (Addendum)
Will treat for urethritis  Rocephin injection in office today  Azithromycin to your pharmacy  Omeprazole daily for reflux  Follow up with this office or with primary care as needed

## 2019-11-19 NOTE — ED Triage Notes (Signed)
Patient complains of pain with urination and cloudy orange penile discharge x1 week. Reports lightheadedness started yesterday.

## 2019-12-01 ENCOUNTER — Other Ambulatory Visit: Payer: Self-pay

## 2019-12-02 ENCOUNTER — Encounter: Payer: Self-pay | Admitting: Family

## 2019-12-02 ENCOUNTER — Other Ambulatory Visit (HOSPITAL_COMMUNITY)
Admission: RE | Admit: 2019-12-02 | Discharge: 2019-12-02 | Disposition: A | Discharge status: Home / Self Care | Payer: BC Managed Care – PPO | Source: Ambulatory Visit | Attending: Family | Admitting: Family

## 2019-12-02 ENCOUNTER — Ambulatory Visit (INDEPENDENT_AMBULATORY_CARE_PROVIDER_SITE_OTHER): Payer: BC Managed Care – PPO | Admitting: Family

## 2019-12-02 VITALS — BP 122/78 | HR 81 | Temp 97.9°F | Ht 76.0 in | Wt 329.8 lb

## 2019-12-02 DIAGNOSIS — Z23 Encounter for immunization: Secondary | ICD-10-CM | POA: Diagnosis not present

## 2019-12-02 DIAGNOSIS — B009 Herpesviral infection, unspecified: Secondary | ICD-10-CM

## 2019-12-02 DIAGNOSIS — B182 Chronic viral hepatitis C: Secondary | ICD-10-CM | POA: Diagnosis not present

## 2019-12-02 DIAGNOSIS — Z113 Encounter for screening for infections with a predominantly sexual mode of transmission: Secondary | ICD-10-CM | POA: Insufficient documentation

## 2019-12-02 DIAGNOSIS — G4733 Obstructive sleep apnea (adult) (pediatric): Secondary | ICD-10-CM

## 2019-12-02 DIAGNOSIS — A599 Trichomoniasis, unspecified: Secondary | ICD-10-CM | POA: Insufficient documentation

## 2019-12-02 DIAGNOSIS — Z0000 Encounter for general adult medical examination without abnormal findings: Secondary | ICD-10-CM | POA: Insufficient documentation

## 2019-12-02 DIAGNOSIS — R42 Dizziness and giddiness: Secondary | ICD-10-CM | POA: Insufficient documentation

## 2019-12-02 DIAGNOSIS — A630 Anogenital (venereal) warts: Secondary | ICD-10-CM

## 2019-12-02 NOTE — Assessment & Plan Note (Signed)
Episodic. Patient is not orthostatic ( see flow sheet). Discussed dizziness however in setting of position changes, and blood pressure being on low end. I advised patient to each small, frequent meals and also drink more fluids.Careful with position changes. Advised to let me know if recurrence.

## 2019-12-02 NOTE — Patient Instructions (Addendum)
Please call for follow up of liver disease, fibrosis gastroenterology  Seth Simmons.  [phone] (Work)  Please drink more water and be careful with position changes.   This is  Dr. Lupita Dawn  example of a  "Low GI"  Diet:  It will allow you to lose 4 to 8  lbs  per month if you follow it carefully.  Your goal with exercise is a minimum of 30 minutes of aerobic exercise 5 days per week (Walking does not count once it becomes easy!)    All of the foods can be found at grocery stores and in bulk at [employer].  The Atkins protein bars and shakes are available in more varieties at [employer], [employer] and [employer].     7 AM Breakfast:  Choose from the following:  Low carbohydrate Protein  Shakes (I recommend the  Premier Protein chocolate shakes,  EAS AdvantEdge "Carb Control" shakes  Or the Atkins shakes all are under 3 net carbs)     a scrambled egg/bacon/cheese burrito made with Mission's "carb balance" whole wheat tortilla  (about 10 net carbs )  Regulatory affairs officer (basically a quiche without the pastry crust) that is eaten cold and very convenient way to get your eggs.  8 carbs)  If you make your own protein shakes, avoid bananas and pineapple,  And use low carb greek yogurt or original /unsweetened almond or soy milk    Avoid cereal and bananas, oatmeal and cream of wheat and grits. They are loaded with carbohydrates!   10 AM: high protein snack:  Protein bar by Atkins (the snack size, under 200 cal, usually < 6 net carbs).    A stick of cheese:  Around 1 carb,  100 cal     Dannon Light n Fit [place] Yogurt  (80 cal, 8 carbs)  Other so called "protein bars" and Greek yogurts tend to be loaded with carbohydrates.  Remember, in food advertising, the word "energy" is synonymous for " carbohydrate."  Lunch:   A Sandwich using the bread choices listed, Can use any  Eggs,  lunchmeat, grilled meat or canned tuna), avocado, regular mayo/mustard  and cheese.  A  Salad using blue cheese, ranch,  Goddess or vinagrette,  Avoid taco shells, croutons or "confetti" and no "candied nuts" but regular nuts OK.   No pretzels, nabs  or chips.  Pickles and miniature sweet peppers are a good low carb alternative that provide a "crunch"  The bread is the only source of carbohydrate in a sandwich and  can be decreased by trying some of the attached alternatives to traditional loaf bread   Avoid "Low fat dressings, as well as [place] dressings They are loaded with sugar!   3 PM/ Mid day  Snack:  Consider  1 ounce of  almonds, walnuts, pistachios, pecans, peanuts,  Macadamia nuts or a nut medley.  Avoid "granola and granola bars "  Mixed nuts are ok in moderation as long as there are no raisins,  cranberries or dried fruit.   KIND bars are OK if you get the low glycemic index variety   Try the prosciutto/mozzarella cheese sticks by Fiorruci  In deli /backery section   High protein      6 PM  Dinner:     Meat/fowl/fish with a green salad, and either broccoli, cauliflower, green beans, spinach, brussel sprouts or  Lima beans. DO NOT BREAD THE PROTEIN!!      There  is a low carb pasta by Dreamfield's that is acceptable and tastes great: only 5 digestible carbs/serving.( All grocery stores but BJs carry it ) Several ready made meals are available low carb:   Try Michel Angelo's chicken piccata or chicken or eggplant parm over low carb pasta.(Lowes and BJs)   Marjory Lies Sanchez's "Carnitas" (pulled pork, no sauce,  0 carbs) or his beef pot roast to make a dinner burrito (at [employer]J's)  Pesto over low carb pasta (bj's sells a good quality pesto in the center refrigerated section of the deli   Try satueeing  Cheral Marker with mushroooms as a good side   [employer] Giant makes a mashed cauliflower that tastes like mashed potatoes  Whole wheat pasta is still full of digestible carbs and  Not as low in glycemic index as Dreamfield's.   Brown rice is still rice,  So skip the  rice and noodles if you eat [place] or [place] (or at least limit to 1/2 cup)  9 PM snack :   Breyer's "low carb" fudgsicle or  ice cream bar (Carb Smart line), or  Weight Watcher's ice cream bar , or another "no sugar added" ice cream;  a serving of fresh berries/cherries with whipped cream   Cheese or DANNON'S LlGHT N FIT GREEK YOGURT  8 ounces of [place] unsweetened almond/cococunut milk    Treat yourself to a parfait made with whipped cream blueberiies, walnuts and vanilla greek yogurt  Avoid bananas, pineapple, grapes  and watermelon on a regular basis because they are high in sugar.  THINK OF THEM AS DESSERT  Remember that snack Substitutions should be less than 10 NET carbs per serving and meals < 20 carbs. Remember to subtract fiber grams to get the "net carbs."   Health Maintenance, Male Adopting a healthy lifestyle and getting preventive care are important in promoting health and wellness. Ask your health care provider about:  The right schedule for you to have regular tests and exams.  Things you can do on your own to prevent diseases and keep yourself healthy. What should I know about diet, weight, and exercise? Eat a healthy diet   Eat a diet that includes plenty of vegetables, fruits, low-fat dairy products, and lean protein.  Do not eat a lot of foods that are high in solid fats, added sugars, or sodium. Maintain a healthy weight Body mass index (BMI) is a measurement that can be used to identify possible weight problems. It estimates body fat based on height and weight. Your health care provider can help determine your BMI and help you achieve or maintain a healthy weight. Get regular exercise Get regular exercise. This is one of the most important things you can do for your health. Most adults should:  Exercise for at least 150 minutes each week. The exercise should increase your heart rate and make you sweat (moderate-intensity exercise).  Do strengthening  exercises at least twice a week. This is in addition to the moderate-intensity exercise.  Spend less time sitting. Even light physical activity can be beneficial. Watch cholesterol and blood lipids Have your blood tested for lipids and cholesterol at 48 years of age, then have this test every 5 years. You may need to have your cholesterol levels checked more often if:  Your lipid or cholesterol levels are high.  You are older than 48 years of age.  You are at high risk for heart disease. What should I know about cancer screening? Many types of cancers can  be detected early and may often be prevented. Depending on your health history and family history, you may need to have cancer screening at various ages. This may include screening for:  Colorectal cancer.  Prostate cancer.  Skin cancer.  Lung cancer. What should I know about heart disease, diabetes, and high blood pressure? Blood pressure and heart disease  High blood pressure causes heart disease and increases the risk of stroke. This is more likely to develop in people who have high blood pressure readings, are of African descent, or are overweight.  Talk with your health care provider about your target blood pressure readings.  Have your blood pressure checked: ? Every 3-5 years if you are 61-107 years of age. ? Every year if you are 21 years old or older.  If you are between the ages of 25 and 30 and are a current or former smoker, ask your health care provider if you should have a one-time screening for abdominal aortic aneurysm (AAA). Diabetes Have regular diabetes screenings. This checks your fasting blood sugar level. Have the screening done:  Once every three years after age 72 if you are at a normal weight and have a low risk for diabetes.  More often and at a younger age if you are overweight or have a high risk for diabetes. What should I know about preventing infection? Hepatitis B If you have a higher risk for  hepatitis B, you should be screened for this virus. Talk with your health care provider to find out if you are at risk for hepatitis B infection. Hepatitis C Blood testing is recommended for:  Everyone born from [birth year] through 1964.  Anyone with known risk factors for hepatitis C. Sexually transmitted infections (STIs)  You should be screened each year for STIs, including gonorrhea and chlamydia, if: ? You are sexually active and are younger than 48 years of age. ? You are older than 48 years of age and your health care provider tells you that you are at risk for this type of infection. ? Your sexual activity has changed since you were last screened, and you are at increased risk for chlamydia or gonorrhea. Ask your health care provider if you are at risk.  Ask your health care provider about whether you are at high risk for HIV. Your health care provider may recommend a prescription medicine to help prevent HIV infection. If you choose to take medicine to prevent HIV, you should first get tested for HIV. You should then be tested every 3 months for as long as you are taking the medicine. Follow these instructions at home: Lifestyle  Do not use any products that contain nicotine or tobacco, such as cigarettes, e-cigarettes, and chewing tobacco. If you need help quitting, ask your health care provider.  Do not use street drugs.  Do not share needles.  Ask your health care provider for help if you need support or information about quitting drugs. Alcohol use  Do not drink alcohol if your health care provider tells you not to drink.  If you drink alcohol: ? Limit how much you have to 0-2 drinks a day. ? Be aware of how much alcohol is in your drink. In the [place] one drink equals one 12 oz bottle of beer (355 mL), one 5 oz glass of wine (148 mL), or one 1 oz glass of hard liquor (44 mL). General instructions  Schedule regular health, dental, and eye exams.  Stay current with your  vaccines.  Tell your health care provider if: ? You often feel depressed. ? You have ever been abused or do not feel safe at home. Summary  Adopting a healthy lifestyle and getting preventive care are important in promoting health and wellness.  Follow your health care provider's instructions about healthy diet, exercising, and getting tested or screened for diseases.  Follow your health care provider's instructions on monitoring your cholesterol and blood pressure. This information is not intended to replace advice given to you by your health care provider. Make sure you discuss any questions you have with your health care provider. Document Revised: 02/05/2018 Document Reviewed: 02/05/2018 Elsevier Patient Education  2020 [employer].

## 2019-12-02 NOTE — Assessment & Plan Note (Addendum)
Due colonoscopy for which has been ordered. Discussed importance of weight loss and discussed low glycemic diet.

## 2019-12-02 NOTE — Assessment & Plan Note (Signed)
No active lesions. Pending hsv screen as part of STD screen today

## 2019-12-02 NOTE — Assessment & Plan Note (Signed)
Reiterated the importance of continued annual follow up with GI and have provided patient with phone number today.

## 2019-12-02 NOTE — Assessment & Plan Note (Signed)
Noncompliant. Advised of increased risk of stroke, MI if not treating. Will continue to discuss at follow up.

## 2019-12-02 NOTE — Progress Notes (Signed)
Subjective:    Patient ID: Seth Simmons, male    DOB: 01-05-1971, 49 y.o.   MRN: [id number]  CC: [name] is a 49 y.o. male who presents today for physical exam and follow up and also acute concerns.    HPI: Multiple complaints   Complains of dizziness for 8 months, which is episodic. Last episode 10 days ago. Occurs when standing and bending. Works in patient transport. episode will last for a couple of days.  Every time have 'a spell' blood pressure is 117/70.  Not drinking a lot of water. Can 'all day without eating.' At work may go entire shift without eating.  Dizziness improves with eating regularly.    No congestion, vertigo, ear pain, nauseated, fever.  Episodes are not associated exertional chest pain or pressure, numbness or tingling radiating to left arm or jaw, palpitations, frequent headaches, changes in vision, or shortness of breath.  No h/o in family of sudden cardiac death.   Frustrated by weight gain and endorses eating more 'soul' food lately. He is not eating as healthy as he had been.    OSA - not wearing cipap and has turned in as felt like suffocating.   GERD- complains of epigastric burning once every 2 months. Worse when eating fruit, such as oranges and bananas. Has 2-3 caffeine soda per week.    Wife has gential herpes. He has had h/o 'blister' lesions in the past, arms or even legs. No penile lesions. No penile pain or abnormal penile discharge. Reports a recent incident and would like STD testing. Trouble maintaining an erection. No dysuria, decreased urinary stream.    Liver fibrosis- US abdomen 12/03/18- for surveillance of his history of hep C and liver fibrosis.     Colorectal  Cancer Screening: due Prostate Cancer Screening: No trouble urinating.   Immunizations       Tetanus - he thinks it has been more than 10 years and that date reported in Epic is inaccurate. He would like tdap booster today.          Hepatitis C screening -  Candidate for. consents HIV Screening- Candidate for , consents Labs: Screening labs today. Exercise: Gets regular exercise at work, walking.  Alcohol use:  weekends Smoking/tobacco use: smoker of cigars   HISTORY:  Past Medical History:  Diagnosis Date  . Anemia   . Arthritis   . Asthma   . Genital warts   . Hepatitis   . Obesity     Past Surgical History:  Procedure Laterality Date  . ESOPHAGOGASTRODUODENOSCOPY (EGD) WITH PROPOFOL N/A 10/21/2014   Procedure: ESOPHAGOGASTRODUODENOSCOPY (EGD) WITH PROPOFOL;  Surgeon: Josefine Class, MD;  Location: [facility] ENDOSCOPY;  Service: Endoscopy;  Laterality: N/A;  . HIP SURGERY Left    Family History  Problem Relation Age of Onset  . Diabetes Mother   . Diabetes Father   . Diabetes Brother   . Cancer Brother   . Diabetes Brother   . Colon cancer Neg Hx       ALLERGIES: Patient has no known allergies.  Current Outpatient Medications on File Prior to Visit  Medication Sig Dispense Refill  . albuterol (VENTOLIN HFA) 108 (90 Base) MCG/ACT inhaler TAKE 2 PUFFS BY MOUTH EVERY 6 HOURS AS NEEDED FOR WHEEZE OR SHORTNESS OF BREATH 6.7 Inhaler 0  . cetirizine (ZYRTEC) 10 MG tablet Take 1 tablet (10 mg total) by mouth daily. 30 tablet 11  . omeprazole (PRILOSEC) 20 MG capsule Take 1 capsule (20 mg  total) by mouth daily. 30 capsule 0  . cholecalciferol (VITAMIN D3) 25 MCG (1000 UT) tablet Take 1,000 Units by mouth daily. (Patient not taking: Reported on 11/19/2019)     No current facility-administered medications on file prior to visit.    Social History   Tobacco Use  . Smoking status: Light Tobacco Smoker    Types: Cigars  . Smokeless tobacco: Never Used  . Tobacco comment: maybe 1 cigar every 3 months   Substance Use Topics  . Alcohol use: Yes    Alcohol/week: 0.0 standard drinks    Comment: weekends;   . Drug use: No    Review of Systems  Constitutional: Negative for chills, fatigue and fever.  HENT: Negative for  congestion.   Eyes: Negative for visual disturbance.  Respiratory: Negative for cough and shortness of breath.   Cardiovascular: Negative for chest pain, palpitations and leg swelling.  Gastrointestinal: Negative for diarrhea, nausea and vomiting.  Genitourinary: Negative for difficulty urinating, discharge and genital sores.  Musculoskeletal: Negative for myalgias.  Skin: Negative for rash.  Neurological: Positive for dizziness. Negative for syncope and headaches.  Hematological: Negative for adenopathy.  Psychiatric/Behavioral: Negative for confusion.      Objective:    BP 122/78   Pulse 81   Temp 97.9 F (36.6 C)   Ht 6\' 4"  (1.93 m)   Wt (!) 329 lb 12.8 oz (149.6 kg)   SpO2 97%   BMI 40.14 kg/m   BP Readings from Last 3 Encounters:  12/02/19 122/78  11/19/19 113/75  08/06/18 118/76   Wt Readings from Last 3 Encounters:  12/02/19 (!) 329 lb 12.8 oz (149.6 kg)  08/06/18 (!) 310 lb (140.6 kg)  04/02/18 (!) 318 lb 6 oz (144.4 kg)    Physical Exam Vitals reviewed.  Constitutional:      Appearance: He is well-developed.  Neck:     Thyroid: No thyroid mass or thyromegaly.  Cardiovascular:     Rate and Rhythm: Regular rhythm.     Heart sounds: Normal heart sounds.  Pulmonary:     Effort: Pulmonary effort is normal. No respiratory distress.     Breath sounds: Normal breath sounds. No wheezing, rhonchi or rales.  Lymphadenopathy:     Head:     Right side of head: No submental, submandibular, tonsillar, preauricular, posterior auricular or occipital adenopathy.     Left side of head: No submental, submandibular, tonsillar, preauricular, posterior auricular or occipital adenopathy.     Cervical: No cervical adenopathy.  Skin:    General: Skin is warm and dry.  Neurological:     Mental Status: He is alert.  Psychiatric:        Speech: Speech normal.        Behavior: Behavior normal.        Assessment & Plan:   Problem List Items Addressed This Visit       Respiratory   OSA (obstructive sleep apnea)    Noncompliant. Advised of increased risk of stroke, MI if not treating. Will continue to discuss at follow up.         Digestive   Hepatitis C virus infection without hepatic coma (Chronic)    Reiterated the importance of continued annual follow up with GI and have provided patient with phone number today.         Musculoskeletal and Integument   Genital warts    No active lesions. Pending hsv screen as part of STD screen today  Other   Dizziness    Episodic. Patient is not orthostatic ( see flow sheet). Discussed dizziness however in setting of position changes, and blood pressure being on low end. I advised patient to each small, frequent meals and also drink more fluids.Careful with position changes. Advised to let me know if recurrence.       Herpes simplex type 2 infection   Relevant Orders   HSV 1/2 Ab (IgM), IFA w/rflx Titer   Routine physical examination - Primary    Due colonoscopy for which has been ordered. Discussed importance of weight loss and discussed low glycemic diet.       Relevant Orders   HSV 1/2 Ab (IgM), IFA w/rflx Titer   RPR   Testosterone , Free and Total   TSH   CBC with Differential/Platelet   Comprehensive metabolic panel   Hemoglobin A1c   Hepatitis C antibody   HIV Antibody (routine testing w rflx)   Lipid panel   PSA   VITAMIN D 25 Hydroxy (Vit-D Deficiency, Fractures)   Urine cytology ancillary only(Lead)   Ambulatory referral to Gastroenterology   Screen for STD (sexually transmitted disease)    Other Visit Diagnoses    Need for Tdap vaccination       Relevant Orders   Tdap vaccine greater than or equal to 7yo IM (Completed)       I have discontinued Ollen Gross [place]'s Symbicort and azithromycin. I am also having him maintain his cholecalciferol, cetirizine, albuterol, and omeprazole.   No orders of the defined types were placed in this encounter.   Return  precautions given.   Risks, benefits, and alternatives of the medications and treatment plan prescribed today were discussed, and patient expressed understanding.   Education regarding symptom management and diagnosis given to patient on AVS.   Continue to follow with Burnard Hawthorne, FNP for routine health maintenance.   Doylene Canard and I agreed with plan.   [name], FNP

## 2019-12-03 LAB — COMPREHENSIVE METABOLIC PANEL
ALT: 15 U/L (ref 0–53)
AST: 17 U/L (ref 0–37)
Albumin: 4.2 g/dL (ref 3.5–5.2)
Alkaline Phosphatase: 81 U/L (ref 39–117)
BUN: 21 mg/dL (ref 6–23)
CO2: 30 mEq/L (ref 19–32)
Calcium: 9 mg/dL (ref 8.4–10.5)
Chloride: 105 mEq/L (ref 96–112)
Creatinine, Ser: 0.91 mg/dL (ref 0.40–1.50)
GFR: 99.2 mL/min (ref >60.00)
Glucose, Bld: 90 mg/dL (ref 70–99)
Potassium: 4.2 mEq/L (ref 3.5–5.1)
Sodium: 141 mEq/L (ref 135–145)
Total Bilirubin: 0.4 mg/dL (ref 0.2–1.2)
Total Protein: 7.4 g/dL (ref 6.0–8.3)

## 2019-12-03 LAB — CBC WITH DIFFERENTIAL/PLATELET
Basophils Absolute: 0.1 10*3/uL (ref 0.0–0.1)
Basophils Relative: 1.2 % (ref 0.0–3.0)
Eosinophils Absolute: 0.2 10*3/uL (ref 0.0–0.7)
Eosinophils Relative: 4 % (ref 0.0–5.0)
HCT: 40.7 % (ref 39.0–52.0)
Hemoglobin: 13.6 g/dL (ref 13.0–17.0)
Lymphocytes Relative: 38.1 % (ref 12.0–46.0)
Lymphs Abs: 1.7 10*3/uL (ref 0.7–4.0)
MCHC: 33.4 g/dL (ref 30.0–36.0)
MCV: 85.6 fl (ref 78.0–100.0)
Monocytes Absolute: 0.4 10*3/uL (ref 0.1–1.0)
Monocytes Relative: 8.3 % (ref 3.0–12.0)
Neutro Abs: 2.1 10*3/uL (ref 1.4–7.7)
Neutrophils Relative %: 48.4 % (ref 43.0–77.0)
Platelets: 166 10*3/uL (ref 150.0–400.0)
RBC: 4.76 Mil/uL (ref 4.22–5.81)
RDW: 14.2 % (ref 11.5–15.5)
WBC: 4.4 10*3/uL (ref 4.0–10.5)

## 2019-12-03 LAB — PSA: PSA: 0.36 ng/mL (ref 0.10–4.00)

## 2019-12-03 LAB — LIPID PANEL
Cholesterol: 200 mg/dL (ref 0–200)
HDL: 73.2 mg/dL (ref >39.00)
LDL Cholesterol: 100 mg/dL — ABNORMAL HIGH (ref 0–99)
NonHDL: 126.94
Total CHOL/HDL Ratio: 3
Triglycerides: 135 mg/dL (ref 0.0–149.0)
VLDL: 27 mg/dL (ref 0.0–40.0)

## 2019-12-03 LAB — TSH: TSH: 1.25 u[IU]/mL (ref 0.35–4.50)

## 2019-12-03 LAB — VITAMIN D 25 HYDROXY (VIT D DEFICIENCY, FRACTURES): VITD: 15.21 ng/mL — ABNORMAL LOW (ref 30.00–100.00)

## 2019-12-03 LAB — HEMOGLOBIN A1C: Hgb A1c MFr Bld: 5.8 % (ref 4.6–6.5)

## 2019-12-04 ENCOUNTER — Other Ambulatory Visit: Payer: Self-pay | Admitting: Family

## 2019-12-04 DIAGNOSIS — A599 Trichomoniasis, unspecified: Secondary | ICD-10-CM

## 2019-12-04 LAB — URINE CYTOLOGY ANCILLARY ONLY
Chlamydia: NEGATIVE
Comment: NEGATIVE
Comment: NEGATIVE
Comment: NORMAL
Neisseria Gonorrhea: NEGATIVE
Trichomonas: POSITIVE — AB

## 2019-12-04 MED ORDER — METRONIDAZOLE 500 MG PO TABS: 2000.0000 mg | ORAL_TABLET | Freq: Once | ORAL | 0 refills | Status: AC

## 2019-12-07 ENCOUNTER — Telehealth: Payer: Self-pay | Admitting: Family

## 2019-12-07 LAB — HSV 1/2 AB (IGM), IFA W/RFLX TITER
HSV 1 IgM Screen: NEGATIVE
HSV 2 IgM Screen: NEGATIVE

## 2019-12-07 LAB — HCV RNA,QUANTITATIVE REAL TIME PCR
HCV Quantitative Log: <1.18 Log IU/mL
HCV RNA, PCR, QN: <15 IU/mL

## 2019-12-07 LAB — RPR: RPR Ser Ql: NONREACTIVE

## 2019-12-07 LAB — HEPATITIS C ANTIBODY
Hepatitis C Ab: REACTIVE — AB
SIGNAL TO CUT-OFF: 30.9 — ABNORMAL HIGH (ref <1.00)

## 2019-12-07 LAB — TESTOSTERONE, FREE & TOTAL
Free Testosterone: 50.3 pg/mL (ref 35.0–155.0)
Testosterone, Total, LC-MS-MS: 332 ng/dL (ref 250–1100)

## 2019-12-07 LAB — HIV ANTIBODY (ROUTINE TESTING W REFLEX): HIV 1&2 Ab, 4th Generation: NONREACTIVE

## 2019-12-07 NOTE — Telephone Encounter (Signed)
Call health department for positive trichomonasis

## 2019-12-07 NOTE — Telephone Encounter (Signed)
I have faxed to health department.

## 2019-12-16 ENCOUNTER — Telehealth: Payer: Self-pay

## 2019-12-16 NOTE — Telephone Encounter (Signed)
Attempted to contact patient at 9:30am and 9:35am LVM asking him to call the office back to reschedule his call.  Thanks,  [place], [place]

## 2019-12-24 ENCOUNTER — Other Ambulatory Visit (INDEPENDENT_AMBULATORY_CARE_PROVIDER_SITE_OTHER): Payer: Self-pay

## 2019-12-24 ENCOUNTER — Telehealth (INDEPENDENT_AMBULATORY_CARE_PROVIDER_SITE_OTHER): Payer: BC Managed Care – PPO | Admitting: Gastroenterology

## 2019-12-24 DIAGNOSIS — Z1211 Encounter for screening for malignant neoplasm of colon: Secondary | ICD-10-CM | POA: Insufficient documentation

## 2019-12-24 MED ORDER — PEG 3350-KCL-NA BICARB-NACL 420 G PO SOLR: 4000.0000 mL | Freq: Once | ORAL | 0 refills | Status: AC

## 2019-12-24 NOTE — Progress Notes (Signed)
Gastroenterology Pre-Procedure Review  Request Date: 01/28/2020 Requesting Physician: Dr. Vicente Males  PATIENT REVIEW QUESTIONS: The patient responded to the following health history questions as indicated:    1. Are you having any GI issues? no 2. Do you have a personal history of Polyps? no 3. Do you have a family history of Colon Cancer or Polyps? no 4. Diabetes Mellitus? no 5. Joint replacements in the past 12 months?no 6. Major health problems in the past 3 months?no 7. Any artificial heart valves, MVP, or defibrillator?no    MEDICATIONS & ALLERGIES:    Patient reports the following regarding taking any anticoagulation/antiplatelet therapy:   Plavix, Coumadin, Eliquis, Xarelto, Lovenox, Pradaxa, Brilinta, or Effient? no Aspirin? no  Patient confirms/reports the following medications:  Current Outpatient Medications  Medication Sig Dispense Refill   albuterol (VENTOLIN HFA) 108 (90 Base) MCG/ACT inhaler TAKE 2 PUFFS BY MOUTH EVERY 6 HOURS AS NEEDED FOR WHEEZE OR SHORTNESS OF BREATH 6.7 Inhaler 0   cetirizine (ZYRTEC) 10 MG tablet Take 1 tablet (10 mg total) by mouth daily. 30 tablet 11   cholecalciferol (VITAMIN D3) 25 MCG (1000 UT) tablet Take 1,000 Units by mouth daily. (Patient not taking: Reported on 11/19/2019)     metroNIDAZOLE (FLAGYL) 500 MG tablet Take by mouth.     omeprazole (PRILOSEC) 20 MG capsule Take 1 capsule (20 mg total) by mouth daily. 30 capsule 0   No current facility-administered medications for this visit.    Patient confirms/reports the following allergies:  No Known Allergies  No orders of the defined types were placed in this encounter.   AUTHORIZATION INFORMATION Primary Insurance: 1D#: Group #:  Secondary Insurance: 1D#: Group #:  SCHEDULE INFORMATION: Date: 01/28/2020 Time: Location: [facility]

## 2020-01-15 ENCOUNTER — Ambulatory Visit: Payer: BC Managed Care – PPO | Admitting: Family

## 2020-01-26 ENCOUNTER — Other Ambulatory Visit: Payer: Self-pay

## 2020-01-26 ENCOUNTER — Other Ambulatory Visit
Admission: RE | Admit: 2020-01-26 | Discharge: 2020-01-26 | Disposition: A | Discharge status: Home / Self Care | Payer: BC Managed Care – PPO | Source: Ambulatory Visit | Attending: Gastroenterology | Admitting: Gastroenterology

## 2020-01-26 ENCOUNTER — Telehealth: Payer: Self-pay

## 2020-01-26 DIAGNOSIS — Z01812 Encounter for preprocedural laboratory examination: Secondary | ICD-10-CM | POA: Insufficient documentation

## 2020-01-26 DIAGNOSIS — Z1211 Encounter for screening for malignant neoplasm of colon: Secondary | ICD-10-CM | POA: Diagnosis not present

## 2020-01-26 DIAGNOSIS — F17290 Nicotine dependence, other tobacco product, uncomplicated: Secondary | ICD-10-CM | POA: Diagnosis not present

## 2020-01-26 DIAGNOSIS — Z79899 Other long term (current) drug therapy: Secondary | ICD-10-CM | POA: Diagnosis not present

## 2020-01-26 DIAGNOSIS — Z20822 Contact with and (suspected) exposure to covid-19: Secondary | ICD-10-CM | POA: Insufficient documentation

## 2020-01-26 DIAGNOSIS — D122 Benign neoplasm of ascending colon: Secondary | ICD-10-CM | POA: Diagnosis not present

## 2020-01-26 NOTE — Telephone Encounter (Signed)
Returned patients call. LVM explaining to patient he will need to have his Covid test done at  [facility]

## 2020-01-27 LAB — SARS CORONAVIRUS 2 (TAT 6-24 HRS): SARS Coronavirus 2: NEGATIVE

## 2020-01-28 ENCOUNTER — Other Ambulatory Visit: Payer: Self-pay

## 2020-01-28 ENCOUNTER — Encounter: Payer: Self-pay | Admitting: Gastroenterology

## 2020-01-28 ENCOUNTER — Ambulatory Visit
Admission: RE | Admit: 2020-01-28 | Discharge: 2020-01-28 | Disposition: A | Discharge status: Home / Self Care | Payer: BC Managed Care – PPO | Attending: Gastroenterology | Admitting: Gastroenterology

## 2020-01-28 ENCOUNTER — Encounter
Admission: RE | Disposition: A | Discharge status: Home / Self Care | Payer: Self-pay | Source: Home / Self Care | Attending: Gastroenterology

## 2020-01-28 ENCOUNTER — Ambulatory Visit: Payer: BC Managed Care – PPO | Admitting: Anesthesiology

## 2020-01-28 DIAGNOSIS — F17290 Nicotine dependence, other tobacco product, uncomplicated: Secondary | ICD-10-CM | POA: Diagnosis not present

## 2020-01-28 DIAGNOSIS — Z79899 Other long term (current) drug therapy: Secondary | ICD-10-CM | POA: Diagnosis not present

## 2020-01-28 DIAGNOSIS — K635 Polyp of colon: Secondary | ICD-10-CM | POA: Diagnosis not present

## 2020-01-28 DIAGNOSIS — Z20822 Contact with and (suspected) exposure to covid-19: Secondary | ICD-10-CM | POA: Insufficient documentation

## 2020-01-28 DIAGNOSIS — D123 Benign neoplasm of transverse colon: Secondary | ICD-10-CM | POA: Diagnosis not present

## 2020-01-28 DIAGNOSIS — Z1211 Encounter for screening for malignant neoplasm of colon: Secondary | ICD-10-CM | POA: Diagnosis not present

## 2020-01-28 DIAGNOSIS — D122 Benign neoplasm of ascending colon: Secondary | ICD-10-CM | POA: Insufficient documentation

## 2020-01-28 HISTORY — PX: COLONOSCOPY WITH PROPOFOL: SHX5780

## 2020-01-28 SURGERY — COLONOSCOPY WITH PROPOFOL
Anesthesia: General

## 2020-01-28 MED ORDER — PROPOFOL 500 MG/50ML IV EMUL
INTRAVENOUS | Status: DC | PRN
  Administered 2020-01-28: 150 ug/kg/min via INTRAVENOUS

## 2020-01-28 MED ORDER — SODIUM CHLORIDE 0.9 % IV SOLN: INTRAVENOUS | Status: DC

## 2020-01-28 MED ORDER — MIDAZOLAM HCL 2 MG/2ML IJ SOLN
INTRAMUSCULAR | Status: AC
  Filled 2020-01-28: qty 2

## 2020-01-28 MED ORDER — MIDAZOLAM HCL 2 MG/2ML IJ SOLN
INTRAMUSCULAR | Status: DC | PRN
  Administered 2020-01-28 (×2): 1 mg via INTRAVENOUS

## 2020-01-28 MED ORDER — PROPOFOL 500 MG/50ML IV EMUL
INTRAVENOUS | Status: AC
  Filled 2020-01-28: qty 50

## 2020-01-28 MED ORDER — PROPOFOL 10 MG/ML IV BOLUS
INTRAVENOUS | Status: AC
  Filled 2020-01-28: qty 20

## 2020-01-28 MED ORDER — PROPOFOL 10 MG/ML IV BOLUS
INTRAVENOUS | Status: DC | PRN
  Administered 2020-01-28: 100 mg via INTRAVENOUS

## 2020-01-28 NOTE — H&P (Signed)
[name], MD [address], [place], [place], [place], [address] [address], [place], [place], [place], [address] Phone: [phone]  Fax: [phone]  Primary Care Physician:  Burnard Hawthorne, FNP   Pre-Procedure History & Physical: HPI:  Seth Simmons is a 49 y.o. male is here for an colonoscopy.   Past Medical History:  Diagnosis Date  . Anemia   . Arthritis   . Asthma   . Genital warts   . Hepatitis   . Obesity     Past Surgical History:  Procedure Laterality Date  . ESOPHAGOGASTRODUODENOSCOPY (EGD) WITH PROPOFOL N/A 10/21/2014   Procedure: ESOPHAGOGASTRODUODENOSCOPY (EGD) WITH PROPOFOL;  Surgeon: Josefine Class, MD;  Location: [facility] ENDOSCOPY;  Service: Endoscopy;  Laterality: N/A;  . HIP SURGERY Left     Prior to Admission medications   Medication Sig Start Date End Date Taking? Authorizing Provider  albuterol (VENTOLIN HFA) 108 (90 Base) MCG/ACT inhaler TAKE 2 PUFFS BY MOUTH EVERY 6 HOURS AS NEEDED FOR WHEEZE OR SHORTNESS OF BREATH 07/07/18  Yes Arnett, Yvetta Coder, FNP  cetirizine (ZYRTEC) 10 MG tablet Take 1 tablet (10 mg total) by mouth daily. 06/13/18  Yes Burnard Hawthorne, FNP  cholecalciferol (VITAMIN D3) 25 MCG (1000 UT) tablet Take 1,000 Units by mouth daily. Patient not taking: Reported on 11/19/2019    [provider]  metroNIDAZOLE (FLAGYL) 500 MG tablet Take by mouth. Patient not taking: Reported on 12/24/2019 12/04/19   [provider]  omeprazole (PRILOSEC) 20 MG capsule Take 1 capsule (20 mg total) by mouth daily. Patient not taking: Reported on 12/24/2019 11/19/19   Faustino Congress, NP    Allergies as of 12/24/2019  . (No Known Allergies)    Family History  Problem Relation Age of Onset  . Diabetes Mother   . Diabetes Father   . Diabetes Brother   . Cancer Brother   . Diabetes Brother   . Colon cancer Neg Hx     Social History   Socioeconomic History  . Marital status: Married    Spouse name:  Not on file  . Number of children: 2  . Years of education: Not on file  . Highest education level: Not on file  Occupational History  . Occupation: Engineer, manufacturing    Comment: [employer]  . Occupation: Patient Transporter    Comment: [employer]  Tobacco Use  . Smoking status: Light Tobacco Smoker    Types: Cigars  . Smokeless tobacco: Never Used  . Tobacco comment: maybe 1 cigar every 3 months   Substance and Sexual Activity  . Alcohol use: Yes    Alcohol/week: 0.0 standard drinks    Comment: weekends;   . Drug use: No  . Sexual activity: Yes    Partners: Female  Other Topics Concern  . Not on file  Social History Narrative   [place], [place] [facility]      Social Determinants of Health   Financial Resource Strain:   . Difficulty of Paying Living Expenses: Not on file  Food Insecurity:   . Worried About Charity fundraiser in the Last Year: Not on file  . Ran Out of Food in the Last Year: Not on file  Transportation Needs:   . Lack of Transportation (Medical): Not on file  . Lack of Transportation (Non-Medical): Not on file  Physical Activity:   . Days of Exercise per Week: Not on file  . Minutes  of Exercise per Session: Not on file  Stress:   . Feeling of Stress : Not on file  Social Connections:   . Frequency of Communication with Friends and Family: Not on file  . Frequency of Social Gatherings with Friends and Family: Not on file  . Attends Religious Services: Not on file  . Active Member of Clubs or Organizations: Not on file  . Attends Archivist Meetings: Not on file  . Marital Status: Not on file  Intimate Partner Violence:   . Fear of Current or Ex-Partner: Not on file  . Emotionally Abused: Not on file  . Physically Abused: Not on file  . Sexually Abused: Not on file    Review of Systems: See HPI, otherwise negative ROS  Physical Exam: BP 116/75   Pulse (!) 58   Temp (!) 97.5 F (36.4 C)  (Tympanic)   Resp 20   Ht 6\' 4"  (1.93 m)   Wt (!) 149.2 kg   SpO2 100%   BMI 40.05 kg/m  General:   Alert,  pleasant and cooperative in NAD Head:  Normocephalic and atraumatic. Neck:  Supple; no masses or thyromegaly. Lungs:  Clear throughout to auscultation, normal respiratory effort.    Heart:  +S1, +S2, Regular rate and rhythm, No edema. Abdomen:  Soft, nontender and nondistended. Normal bowel sounds, without guarding, and without rebound.   Neurologic:  Alert and  oriented x4;  grossly normal neurologically.  Impression/Plan: Seth Simmons is here for an colonoscopy to be performed for Screening colonoscopy average risk   Risks, benefits, limitations, and alternatives regarding  colonoscopy have been reviewed with the patient.  Questions have been answered.  All parties agreeable.   [name], MD  01/28/2020, 9:01 AM

## 2020-01-28 NOTE — Anesthesia Preprocedure Evaluation (Addendum)
Anesthesia Evaluation  Patient identified by MRN, date of birth, ID band Patient awake    Reviewed: Allergy & Precautions, H&P , NPO status , reviewed documented beta blocker date and time   Airway Mallampati: II  TM Distance: >3 FB Neck ROM: full    Dental  (+) Chipped, Missing, Partial Upper   Pulmonary asthma , sleep apnea , Current Smoker and Patient abstained from smoking.,  Mild OSA, doesn't use CPAP. Education done for TIVA and OSA   Pulmonary exam normal        Cardiovascular Normal cardiovascular exam     Neuro/Psych    GI/Hepatic neg GERD  ,(+) Hepatitis -  Endo/Other    Renal/GU      Musculoskeletal  (+) Arthritis ,   Abdominal   Peds  Hematology  (+) Blood dyscrasia, anemia ,   Anesthesia Other Findings Past Medical History: No date: Anemia No date: Arthritis No date: Asthma No date: Genital warts No date: Hepatitis No date: Obesity  Past Surgical History: 10/21/2014: ESOPHAGOGASTRODUODENOSCOPY (EGD) WITH PROPOFOL; N/A     Comment:  Procedure: ESOPHAGOGASTRODUODENOSCOPY (EGD) WITH               PROPOFOL;  Surgeon: Josefine Class, MD;  Location:               [facility] ENDOSCOPY;  Service: Endoscopy;  Laterality: N/A; No date: HIP SURGERY; Left  BMI    Body Mass Index: 40.05 kg/m      Reproductive/Obstetrics                            Anesthesia Physical Anesthesia Plan  ASA: II  Anesthesia Plan: General   Post-op Pain Management:    Induction: Intravenous  PONV Risk Score and Plan: Treatment may vary due to age or medical condition and TIVA  Airway Management Planned: Nasal Cannula and Natural Airway  Additional Equipment:   Intra-op Plan:   Post-operative Plan:   Informed Consent: I have reviewed the patients History and Physical, chart, labs and discussed the procedure including the risks, benefits and alternatives for the proposed anesthesia with  the patient or authorized representative who has indicated his/her understanding and acceptance.     Dental Advisory Given  Plan Discussed with: CRNA  Anesthesia Plan Comments:        Anesthesia Quick Evaluation

## 2020-01-28 NOTE — Anesthesia Postprocedure Evaluation (Signed)
Anesthesia Post Note  Patient: Brennon Otterness  Procedure(s) Performed: COLONOSCOPY WITH PROPOFOL (N/A )  Patient location during evaluation: Endoscopy Anesthesia Type: General Level of consciousness: awake and alert Pain management: pain level controlled Vital Signs Assessment: post-procedure vital signs reviewed and stable Respiratory status: spontaneous breathing, nonlabored ventilation and respiratory function stable Cardiovascular status: blood pressure returned to baseline and stable Postop Assessment: no apparent nausea or vomiting Anesthetic complications: no   No complications documented.   Last Vitals:  Vitals:   01/28/20 0958 01/28/20 1008  BP: 106/64 112/74  Pulse: 65 (!) 58  Resp: 14 15  Temp:    SpO2: 100% 100%    Last Pain:  Vitals:   01/28/20 1008  TempSrc:   PainSc: 0-No pain                 Alphonsus Sias

## 2020-01-28 NOTE — Op Note (Signed)
[facility] Gastroenterology Patient Name: Seth Simmons Procedure Date: 01/28/2020 9:12 AM MRN: [id number] Account #: [id number] Date of Birth: [birth year] Admit Type: Outpatient Age: 49 Room: [facility] ENDO ROOM 3 Gender: Male Note Status: Finalized Procedure:             Colonoscopy Indications:           Screening for colorectal malignant neoplasm Providers:             [name] MD, MD Referring MD:          Yvetta Coder. Arnett (Referring MD) Medicines:             Monitored Anesthesia Care Complications:         No immediate complications. Procedure:             Pre-Anesthesia Assessment:                        - Prior to the procedure, a History and Physical was                         performed, and patient medications, allergies and                         sensitivities were reviewed. The patient's tolerance                         of previous anesthesia was reviewed.                        - The risks and benefits of the procedure and the                         sedation options and risks were discussed with the                         patient. All questions were answered and informed                         consent was obtained.                        - ASA Grade Assessment: III - A patient with severe                         systemic disease.                        After obtaining informed consent, the colonoscope was                         passed under direct vision. Throughout the procedure,                         the patient's blood pressure, pulse, and oxygen                         saturations were monitored continuously. The                         Colonoscope was introduced through the anus and  advanced to the the cecum, identified by the                         appendiceal orifice. The colonoscopy was performed                         with ease. The patient tolerated the procedure well.                         The quality  of the bowel preparation was good. Findings:      The perianal and digital rectal examinations were normal.      Two sessile polyps were found in the transverse colon and ascending       colon. The polyps were 4 to 5 mm in size. These polyps were removed with       a cold snare. Resection and retrieval were complete.      The exam was otherwise without abnormality on direct and retroflexion       views. Impression:            - Two 4 to 5 mm polyps in the transverse colon and in                         the ascending colon, removed with a cold snare.                         Resected and retrieved.                        - The examination was otherwise normal on direct and                         retroflexion views. Recommendation:        - Discharge patient to home (with escort).                        - Resume previous diet.                        - Continue present medications.                        - Await pathology results.                        - Repeat colonoscopy for surveillance based on                         pathology results. Procedure Code(s):     --- Professional ---                        [phone], Colonoscopy, flexible; with removal of                         tumor(s), polyp(s), or other lesion(s) by snare                         technique Diagnosis Code(s):     --- Professional ---  Z12.11, Encounter for screening for malignant neoplasm                         of colon                        K63.5, Polyp of colon CPT copyright 2019 American Medical Association. All rights reserved. The codes documented in this report are preliminary and upon coder review may  be revised to meet current compliance requirements. [name], MD [name] MD, MD 01/28/2020 9:46:42 AM This report has been signed electronically. Number of Addenda: 0 Note Initiated On: 01/28/2020 9:12 AM Scope Withdrawal Time: 0 hours 21 minutes 41 seconds  Total Procedure Duration: 0  hours 26 minutes 49 seconds  Estimated Blood Loss:  Estimated blood loss: none.      [facility]

## 2020-01-28 NOTE — Transfer of Care (Signed)
Immediate Anesthesia Transfer of Care Note  Patient: Seth Simmons  Procedure(s) Performed: COLONOSCOPY WITH PROPOFOL (N/A )  Patient Location: Endoscopy Unit  Anesthesia Type:General  Level of Consciousness: drowsy and patient cooperative  Airway & Oxygen Therapy: Patient Spontanous Breathing  Post-op Assessment: Report given to RN and Post -op Vital signs reviewed and stable  Post vital signs: Reviewed and stable  Last Vitals:  Vitals Value Taken Time  BP 119/75 01/28/20 0950  Temp    Pulse    Resp 18 01/28/20 0950  SpO2 97 % 01/28/20 0950    Last Pain:  Vitals:   01/28/20 0753  TempSrc: Tympanic  PainSc: 0-No pain         Complications: No complications documented.

## 2020-01-29 ENCOUNTER — Encounter: Payer: Self-pay | Admitting: Gastroenterology

## 2020-01-29 LAB — SURGICAL PATHOLOGY

## 2020-02-01 ENCOUNTER — Encounter: Payer: Self-pay | Admitting: Gastroenterology

## 2020-02-16 ENCOUNTER — Telehealth: Payer: Self-pay

## 2020-02-16 NOTE — Telephone Encounter (Signed)
Spoken to patient. He is having heaviness and tingling on right side of face x one week. No signs and sx of stroke. No sx of SOB, chest px, headache, blurry vision, jaw px, arm px, facial droop. Only sx he has is the heaviness and tingling. Appointment scheduled tomrrow at 1130.

## 2020-02-16 NOTE — Telephone Encounter (Signed)
Pt states that he is having tingling/heavy feeling on the right side of his face, going down into his adam's apple/neck. He states that this has been going on for about a week and that it is getting worse. Pt denies SOB, chest pain. Transferred to Lattie Haw at [facility] to triage.

## 2020-02-16 NOTE — Telephone Encounter (Signed)
Pt was cold transferred back to our office by access Nurse  to schedule an appt within 24 hours  transferred to [facility]

## 2020-02-17 ENCOUNTER — Ambulatory Visit (INDEPENDENT_AMBULATORY_CARE_PROVIDER_SITE_OTHER): Payer: BC Managed Care – PPO

## 2020-02-17 ENCOUNTER — Other Ambulatory Visit: Payer: Self-pay

## 2020-02-17 ENCOUNTER — Ambulatory Visit: Payer: BC Managed Care – PPO | Admitting: Internal Medicine

## 2020-02-17 ENCOUNTER — Encounter: Payer: Self-pay | Admitting: Internal Medicine

## 2020-02-17 VITALS — BP 120/76 | HR 68 | Temp 98.2°F | Ht 76.0 in | Wt 339.6 lb

## 2020-02-17 DIAGNOSIS — E559 Vitamin D deficiency, unspecified: Secondary | ICD-10-CM | POA: Diagnosis not present

## 2020-02-17 DIAGNOSIS — M25522 Pain in left elbow: Secondary | ICD-10-CM | POA: Diagnosis not present

## 2020-02-17 DIAGNOSIS — R200 Anesthesia of skin: Secondary | ICD-10-CM | POA: Diagnosis not present

## 2020-02-17 DIAGNOSIS — E538 Deficiency of other specified B group vitamins: Secondary | ICD-10-CM | POA: Diagnosis not present

## 2020-02-17 DIAGNOSIS — M19022 Primary osteoarthritis, left elbow: Secondary | ICD-10-CM | POA: Diagnosis not present

## 2020-02-17 DIAGNOSIS — M62838 Other muscle spasm: Secondary | ICD-10-CM

## 2020-02-17 DIAGNOSIS — G8929 Other chronic pain: Secondary | ICD-10-CM | POA: Diagnosis not present

## 2020-02-17 DIAGNOSIS — M542 Cervicalgia: Secondary | ICD-10-CM | POA: Diagnosis not present

## 2020-02-17 DIAGNOSIS — R202 Paresthesia of skin: Secondary | ICD-10-CM

## 2020-02-17 DIAGNOSIS — M47812 Spondylosis without myelopathy or radiculopathy, cervical region: Secondary | ICD-10-CM | POA: Diagnosis not present

## 2020-02-17 LAB — BASIC METABOLIC PANEL
BUN: 16 mg/dL (ref 6–23)
CO2: 31 mEq/L (ref 19–32)
Calcium: 8.9 mg/dL (ref 8.4–10.5)
Chloride: 105 mEq/L (ref 96–112)
Creatinine, Ser: 0.81 mg/dL (ref 0.40–1.50)
GFR: 104.37 mL/min (ref >60.00)
Glucose, Bld: 91 mg/dL (ref 70–99)
Potassium: 4.7 mEq/L (ref 3.5–5.1)
Sodium: 139 mEq/L (ref 135–145)

## 2020-02-17 LAB — VITAMIN B12: Vitamin B-12: 402 pg/mL (ref 211–911)

## 2020-02-17 MED ORDER — CYCLOBENZAPRINE HCL 5 MG PO TABS: 5.0000 mg | ORAL_TABLET | Freq: Every evening | ORAL | 2 refills | Status: DC | PRN

## 2020-02-17 MED ORDER — CHOLECALCIFEROL 1.25 MG (50000 UT) PO CAPS: 50000.0000 [IU] | ORAL_CAPSULE | ORAL | 1 refills | Status: DC

## 2020-02-17 NOTE — Progress Notes (Addendum)
Chief Complaint  Patient presents with  . Pain  . Tingling   F/u  1. X 1.5 weeks c/o tingling right side of face I.e cheek and right posterior neck, right anterior lateral neck and neck has been sore with lateral rotation turning side to side not up and down. He sleeps sometimes with head downward in the car. Denies weakness, h/a, neck pain 4-5/10 at times tried tylenol nsaids otc and describes right neck pain as sharp. Denies dental pain but he does have cavities he needs filled.  He had J&J vaccine 07/2019  2. Chronic left elbow pain x 8 months to 1 year no swelling no pain today    Review of Systems  Constitutional: Negative for weight loss.  HENT: Negative for hearing loss.   Eyes: Negative for blurred vision.  Respiratory: Negative for shortness of breath.   Cardiovascular: Negative for chest pain.  Musculoskeletal: Positive for joint pain and neck pain.  Skin: Negative for rash.  Neurological: Positive for sensory change.   Past Medical History:  Diagnosis Date  . Anemia   . Arthritis   . Asthma   . Genital warts   . Hepatitis   . Obesity    Past Surgical History:  Procedure Laterality Date  . COLONOSCOPY WITH PROPOFOL N/A 01/28/2020   Procedure: COLONOSCOPY WITH PROPOFOL;  Surgeon: Wyline Mood, MD;  Location: [facility] ENDOSCOPY;  Service: Gastroenterology;  Laterality: N/A;  . ESOPHAGOGASTRODUODENOSCOPY (EGD) WITH PROPOFOL N/A 10/21/2014   Procedure: ESOPHAGOGASTRODUODENOSCOPY (EGD) WITH PROPOFOL;  Surgeon: Elnita Maxwell, MD;  Location: [facility] ENDOSCOPY;  Service: Endoscopy;  Laterality: N/A;  . HIP SURGERY Left    Family History  Problem Relation Age of Onset  . Diabetes Mother   . Diabetes Father   . Diabetes Brother   . Cancer Brother   . Diabetes Brother   . Colon cancer Neg Hx    Social History   Socioeconomic History  . Marital status: Married    Spouse name: Not on file  . Number of children: 2  . Years of education: Not on file  . Highest education  level: Not on file  Occupational History  . Occupation: Insurance claims handler    Comment: [employer]  . Occupation: Patient Transporter    Comment: [employer]  Tobacco Use  . Smoking status: Former Smoker    Types: Cigars  . Smokeless tobacco: Never Used  . Tobacco comment: maybe 1 cigar every 3 months   Substance and Sexual Activity  . Alcohol use: Yes    Alcohol/week: 0.0 standard drinks    Comment: weekends;   . Drug use: No  . Sexual activity: Yes    Partners: Female  Other Topics Concern  . Not on file  Social History Narrative   [place] [employer] [place], [employer]      Social Determinants of Health   Financial Resource Strain: Not on file  Food Insecurity: Not on file  Transportation Needs: Not on file  Physical Activity: Not on file  Stress: Not on file  Social Connections: Not on file  Intimate Partner Violence: Not on file   Current Meds  Medication Sig  . albuterol (VENTOLIN HFA) 108 (90 Base) MCG/ACT inhaler TAKE 2 PUFFS BY MOUTH EVERY 6 HOURS AS NEEDED FOR WHEEZE OR SHORTNESS OF BREATH  . cetirizine (ZYRTEC) 10 MG tablet Take 1 tablet (10 mg total) by mouth daily.  . cholecalciferol (VITAMIN D3) 25 MCG (1000 UT) tablet Take 1,000 Units  by mouth daily.  . metroNIDAZOLE (FLAGYL) 500 MG tablet Take by mouth.  Marland Kitchen omeprazole (PRILOSEC) 20 MG capsule Take 1 capsule (20 mg total) by mouth daily.   No Known Allergies Recent Results (from the past 2160 hour(s))  HSV 1/2 Ab (IgM), IFA w/rflx Titer     Status: None   Collection Time: 12/02/19  3:19 PM  Result Value Ref Range   HSV 1 IgM Screen Negative Negative   HSV 2 IgM Screen Negative Negative    Comment: . The IFA procedure for measuring IgM antibodies to HSV 1 and HSV 2 detects both type-common and type-specific HSV antibodies. Thus, IgM reactivity to both HSV 1 and HSV 2 may represent crossreactive HSV antibodies rather than exposure to both HSV 1 and HSV 2. . This test was  developed and its analytical performance characteristics have been determined by [employer], [place], [place]. It has not been cleared or approved by the FDA. This assay has been validated pursuant to the CLIA regulations and is used for clinical purposes. .   RPR     Status: None   Collection Time: 12/02/19  3:19 PM  Result Value Ref Range   RPR Ser Ql NON-REACTIVE NON-REACTI  Testosterone , Free and Total     Status: None   Collection Time: 12/02/19  3:19 PM  Result Value Ref Range   Testosterone, Total, LC-MS-MS 332 250 - 1,100 ng/dL    Comment: Men with clinically significant hypogonadal symptoms and testosterone values repeatedly in the range of the 200-300 ng/dL or less, may benefit from testosterone treatment after adequate risk and benefits counseling. . For additional information, please refer to https://education.questdiagnostics.com/faq/FAQ165 (This link is being provided for informational/educational purposes only.) (Note) . This test was developed and its analytical performance characteristics have been determined by [facility]. It has not been cleared or approved by the FDA. This assay has been validated pursuant to the CLIA regulations and is used for clinical purposes. . Tandy Gaw Testosterone 50.3 35.0 - 155.0 pg/mL    Comment: (Note) This test was developed and its analytical performance  characteristics have been determined by [facility]. It has not been  cleared or approved by the FDA. This assay has been validated  pursuant to the CLIA regulations and is used for clinical purposes. . [facility] [address] [place] [address] [phone] Eulah Pont, MD   TSH     Status: None   Collection Time: 12/02/19  3:19 PM  Result Value Ref Range   TSH 1.25 0.35 - 4.50 uIU/mL  CBC with Differential/Platelet     Status: None   Collection Time: 12/02/19  3:19 PM  Result Value Ref Range   WBC 4.4 4.0 -  10.5 K/uL   RBC 4.76 4.22 - 5.81 Mil/uL   Hemoglobin 13.6 13.0 - 17.0 g/dL   HCT 25.4 27.0 - 62.3 %   MCV 85.6 78.0 - 100.0 fl   MCHC 33.4 30.0 - 36.0 g/dL   RDW 76.2 83.1 - 51.7 %   Platelets 166.0 150.0 - 400.0 K/uL   Neutrophils Relative % 48.4 43.0 - 77.0 %   Lymphocytes Relative 38.1 12.0 - 46.0 %   Monocytes Relative 8.3 3.0 - 12.0 %   Eosinophils Relative 4.0 0.0 - 5.0 %   Basophils Relative 1.2 0.0 - 3.0 %   Neutro Abs 2.1 1.4 - 7.7 K/uL   Lymphs Abs 1.7 0.7 - 4.0 K/uL  Monocytes Absolute 0.4 0.1 - 1.0 K/uL   Eosinophils Absolute 0.2 0.0 - 0.7 K/uL   Basophils Absolute 0.1 0.0 - 0.1 K/uL  Comprehensive metabolic panel     Status: None   Collection Time: 12/02/19  3:19 PM  Result Value Ref Range   Sodium 141 135 - 145 mEq/L   Potassium 4.2 3.5 - 5.1 mEq/L   Chloride 105 96 - 112 mEq/L   CO2 30 19 - 32 mEq/L   Glucose, Bld 90 70 - 99 mg/dL   BUN 21 6 - 23 mg/dL   Creatinine, Ser 1.610.91 0.40 - 1.50 mg/dL   Total Bilirubin 0.4 0.2 - 1.2 mg/dL   Alkaline Phosphatase 81 39 - 117 U/L   AST 17 0 - 37 U/L   ALT 15 0 - 53 U/L   Total Protein 7.4 6.0 - 8.3 g/dL   Albumin 4.2 3.5 - 5.2 g/dL   GFR 09.6099.20 >45.40>60.00 mL/min   Calcium 9.0 8.4 - 10.5 mg/dL  Hemoglobin J8JA1c     Status: None   Collection Time: 12/02/19  3:19 PM  Result Value Ref Range   Hgb A1c MFr Bld 5.8 4.6 - 6.5 %    Comment: Glycemic Control Guidelines for People with Diabetes:Non Diabetic:  <6%Goal of Therapy: <7%Additional Action Suggested:  >8%   Hepatitis C antibody     Status: Abnormal   Collection Time: 12/02/19  3:19 PM  Result Value Ref Range   Hepatitis C Ab REACTIVE (A) NON-REACTI   SIGNAL TO CUT-OFF 30.90 (H) <1.00    Comment: . Based on this result, the sample will be tested for HCV RNA by a Nucleic Acid Amplification Test (NAAT) to determine if the patient has a current active infection. Marland Kitchen.   HIV Antibody (routine testing w rflx)     Status: None   Collection Time: 12/02/19  3:19 PM  Result Value  Ref Range   HIV 1&2 Ab, 4th Generation NON-REACTIVE NON-REACTI    Comment: HIV-1 antigen and HIV-1/HIV-2 antibodies were not detected. There is no laboratory evidence of HIV infection. Marland Kitchen. PLEASE NOTE: This information has been disclosed to you from records whose confidentiality may be protected by state law.  If your state requires such protection, then the state law prohibits you from making any further disclosure of the information without the specific written consent of the person to whom it pertains, or as otherwise permitted by law. A general authorization for the release of medical or other information is NOT sufficient for this purpose. . For additional information please refer to http://education.questdiagnostics.com/faq/FAQ106 (This link is being provided for informational/ educational purposes only.) . Marland Kitchen. The performance of this assay has not been clinically validated in patients less than 48 years old. .   Lipid panel     Status: Abnormal   Collection Time: 12/02/19  3:19 PM  Result Value Ref Range   Cholesterol 200 0 - 200 mg/dL    Comment: ATP III Classification       Desirable:  < 200 mg/dL               Borderline High:  200 - 239 mg/dL          High:  > = 191240 mg/dL   Triglycerides 478.2135.0 0.0 - 149.0 mg/dL    Comment: Normal:  <956<150 mg/dLBorderline High:  150 - 199 mg/dL   HDL 21.3073.20 >86.57>39.00 mg/dL   VLDL 84.627.0 0.0 - 96.240.0 mg/dL   LDL Cholesterol 952100 (H) 0 -  99 mg/dL   Total CHOL/HDL Ratio 3     Comment:                Men          Women1/2 Average Risk     3.4          3.3Average Risk          5.0          4.42X Average Risk          9.6          7.13X Average Risk          15.0          11.0                       NonHDL 126.94     Comment: NOTE:  Non-HDL goal should be 30 mg/dL higher than patient's LDL goal (i.e. LDL goal of < 70 mg/dL, would have non-HDL goal of < 100 mg/dL)  PSA     Status: None   Collection Time: 12/02/19  3:19 PM  Result Value Ref Range   PSA  0.36 0.10 - 4.00 ng/mL    Comment: Test performed using Access Hybritech PSA Assay, a parmagnetic partical, chemiluminecent immunoassay.  VITAMIN D 25 Hydroxy (Vit-D Deficiency, Fractures)     Status: Abnormal   Collection Time: 12/02/19  3:19 PM  Result Value Ref Range   VITD 15.21 (L) 30.00 - 100.00 ng/mL  HCV RNA, Quantitative Real Time PCR     Status: None   Collection Time: 12/02/19  3:19 PM  Result Value Ref Range   HCV RNA, PCR, QN <15 NOT DETECTED NOT DETECT IU/mL   HCV Quantitative Log <1.18 NOT DETECTED NOT DETECT Log IU/mL    Comment: . HCV RNA is not detected.  There is no laboratory  evidence of a current active HCV infection.  . This pattern of results (undetectable HCV RNA combined with reactive HCV antibody) could be consistent with a resolved past infection if the clinical history is  compatible with previous HCV exposure.  However, if no previous exposure is suspected, the reactive HCV  antibody could be a biological false positive result. . . This test was performed using Real-Time Polymerase Chain Reaction. . Reportable Range: 15 IU/mL to 100,000,000 IU/mL (1.18 Log IU/mL to 8.00 Log IU/mL). . The analytical performance characteristics of this assay have been determined by [facility].  The modifications have not been cleared or approved by the FDA. This assay has been validated pursuant to the  CLIA regulations and is used for clinical purposes.   . For more information on this test, go to: http://education.questdiagnostics.com/faq/FAQ22v1 (This link is being provided for in formational/ educational purposes only.) . This assay is intended for use as an aid in the diagnosis of HCV infection and the management of HCV infected patients undergoing anti-viral therapy. .   Urine cytology ancillary only([place])     Status: Abnormal   Collection Time: 12/02/19  3:19 PM  Result Value Ref Range   Neisseria Gonorrhea Negative    Chlamydia  Negative    Trichomonas Positive (A)    Comment Normal Reference Range Trichomonas - Negative    Comment Normal Reference Ranger Chlamydia - Negative    Comment      Normal Reference Range Neisseria Gonorrhea - Negative  SARS CORONAVIRUS 2 (TAT 6-24 HRS) Nasopharyngeal Nasopharyngeal Swab     Status: None  Collection Time: 01/26/20  1:49 PM   Specimen: Nasopharyngeal Swab  Result Value Ref Range   SARS Coronavirus 2 NEGATIVE NEGATIVE    Comment: (NOTE) SARS-CoV-2 target nucleic acids are NOT DETECTED.  The SARS-CoV-2 RNA is generally detectable in upper and lower respiratory specimens during the acute phase of infection. Negative results do not preclude SARS-CoV-2 infection, do not rule out co-infections with other pathogens, and should not be used as the sole basis for treatment or other patient management decisions. Negative results must be combined with clinical observations, patient history, and epidemiological information. The expected result is Negative.  Fact Sheet for Patients: SugarRoll.be  Fact Sheet for Healthcare Providers: https://www.woods-mathews.com/  This test is not yet approved or cleared by the [place] FDA and  has been authorized for detection and/or diagnosis of SARS-CoV-2 by FDA under an Emergency Use Authorization (EUA). This EUA will remain  in effect (meaning this test can be used) for the duration of the COVID-19 declaration under Se ction 564(b)(1) of the Act, 21 U.S.C. section 360bbb-3(b)(1), unless the authorization is terminated or revoked sooner.  Performed at [facility], [place] [address], [place], [place] [address]   Surgical pathology     Status: None   Collection Time: 01/28/20  9:21 AM  Result Value Ref Range   SURGICAL PATHOLOGY      SURGICAL PATHOLOGY CASE: [id number] PATIENT: Doylene Canard Surgical Pathology Report     Specimen Submitted: A. Colon polyp, transverse;  cs B. Colon polyp, ascending; cs  Clinical History: Encounter screening for colonoscopy Z12.11, Polyps    DIAGNOSIS: A. COLON POLYP, TRANSVERSE; COLD SNARE: - TUBULAR ADENOMA. - NEGATIVE FOR HIGH-GRADE DYSPLASIA AND MALIGNANCY.  B. COLON POLYP, ASCENDING; COLD SNARE: - SINGLE MINUTE FRAGMENT OF UNREMARKABLE COLONIC MUCOSA WITH SMALL LYMPHOID AGGREGATE. - PREDOMINANTLY FECAL MATERIAL. - NEGATIVE FOR DYSPLASIA AND MALIGNANCY.  Comment: Multiple additional deeper recut levels were examined.  GROSS DESCRIPTION: A. Labeled: Cold snare transverse colon polyp Received: Formalin Tissue fragment(s): 1 Size: 1.1 cm Description: Received is a single fragment of tan-pink soft tissue. There is a resection margin which is inked black.  The specimen is bisected. Entirely submitted in 1 cassette.  B. Labeled: Cold snare asc ending colon polyp Received: Formalin Tissue fragment(s): Multiple Size: Aggregate, 1.9 x 0.5 x 0.2 cm Description: Received are fragments of yellow and brown fecal matter.  A distinct portion of soft tissue is not grossly appreciated; however, fragments may be thoroughly mixed in with the fecal matter and not readily observed. Entirely submitted in 1 cassette.   Final Diagnosis performed by Allena Napoleon, MD.   Electronically signed 01/29/2020 2:47:14PM The electronic signature indicates that the named Attending Pathologist has evaluated the specimen Technical component performed at [facility], [address] [place], [place] [address] Lab: [phone] Dir: Rush Farmer, MD, MMM  Professional component performed at [facility], [facility], [place], [place], [place] [address] Lab: [phone] Dir: Dellia Nims. Rubinas, MD    Objective  Body mass index is 41.34 kg/m. Wt Readings from Last 3 Encounters:  02/17/20 (!) 339 lb 9.6 oz (154 kg)  01/28/20 (!) 329 lb (149.2 kg)  12/02/19 (!) 329 lb 12.8 oz (149.6 kg)   Temp Readings from Last 3  Encounters:  02/17/20 98.2 F (36.8 C) (Oral)  01/28/20 (!) 97.5 F (36.4 C) (Temporal)  12/02/19 97.9 F (36.6 C)   BP Readings from Last 3 Encounters:  02/17/20 120/76  01/28/20 112/74  12/02/19 122/78   Pulse  Readings from Last 3 Encounters:  02/17/20 68  01/28/20 (!) 58  12/02/19 81    Physical Exam Vitals and nursing note reviewed.  Constitutional:      Appearance: Normal appearance. He is well-developed and well-groomed. He is morbidly obese.  HENT:     Head: Normocephalic and atraumatic.  Eyes:     Conjunctiva/sclera: Conjunctivae normal.     Pupils: Pupils are equal, round, and reactive to light.  Cardiovascular:     Rate and Rhythm: Normal rate and regular rhythm.     Heart sounds: Normal heart sounds. No murmur heard.   Pulmonary:     Effort: Pulmonary effort is normal.     Breath sounds: Normal breath sounds.  Skin:    General: Skin is warm and dry.  Neurological:     General: No focal deficit present.     Mental Status: He is alert and oriented to person, place, and time. Mental status is at baseline.     Motor: No weakness.     Gait: Gait normal.     Comments: Monofilament nl V1-3 b/l and upper and lower ext b/l  Cn 2-12 grossly intact no neuro defects  Psychiatric:        Attention and Perception: Attention and perception normal.        Mood and Affect: Mood and affect normal.        Speech: Speech normal.        Behavior: Behavior normal. Behavior is cooperative.        Thought Content: Thought content normal.        Cognition and Memory: Cognition and memory normal.        Judgment: Judgment normal.     Assessment  Plan   Paresthesia - Plan: Antinuclear Antib (ANA), Basic Metabolic Panel (BMET), MR Brain W Wo Contrast Numbness and tingling of right face, right lateral/ant neck and right parietal posterior scalp - Plan: Antinuclear Antib (ANA), Basic Metabolic Panel (BMET), MR Brain W Wo Contrast If imaging negative consider neurology  referral  Had J&J vx 07/2019 of note   Vitamin D deficiency - Plan: Cholecalciferol 1.25 MG (50000 UT) capsule weekly x 6 months  B12 deficiency - Plan: Vitamin B12  Neck pain - Plan: DG Cervical Spine Complete, cyclobenzaprine (FLEXERIL) 5 MG tablet  Left elbow pain - Plan: DG Elbow Complete Left, Basic Metabolic Panel (BMET) Declines ortho referral today   Muscle spasm - Plan: cyclobenzaprine (FLEXERIL) 5 MG tablet   Provider: Dr. Olivia Mackie McLean-Scocuzza-Internal Medicine

## 2020-02-17 NOTE — Patient Instructions (Addendum)
Paresthesia Paresthesia is an abnormal burning or prickling sensation. It is usually felt in the hands, arms, legs, or feet. However, it may occur in any part of the body. Usually, paresthesia is not painful. It may feel like:  Tingling or numbness.  Buzzing.  Itching. Paresthesia may occur without any clear cause, or it may be caused by:  Breathing too quickly (hyperventilation).  Pressure on a nerve.  An underlying medical condition.  Side effects of a medication.  Nutritional deficiencies.  Exposure to toxic chemicals. Most people experience temporary (transient) paresthesia at some time in their lives. For some people, it may be long-lasting (chronic) because of an underlying medical condition. If you have paresthesia that lasts a long time, you may need to be evaluated by your health care provider. Follow these instructions at home: Alcohol use   Do not drink alcohol if: ? Your health care provider tells you not to drink. ? You are pregnant, may be pregnant, or are planning to become pregnant.  If you drink alcohol: ? Limit how much you use to:  0-1 drink a day for women.  0-2 drinks a day for men. ? Be aware of how much alcohol is in your drink. In the [place] one drink equals one 12 oz bottle of beer (355 mL), one 5 oz glass of wine (148 mL), or one 1 oz glass of hard liquor (44 mL). Nutrition   Eat a healthy diet. This includes: ? Eating foods that are high in fiber, such as fresh fruits and vegetables, whole grains, and beans. ? Limiting foods that are high in fat and processed sugars, such as fried or sweet foods. General instructions  Take over-the-counter and prescription medicines only as told by your health care provider.  Do not use any products that contain nicotine or tobacco, such as cigarettes and e-cigarettes. These can keep blood from reaching damaged nerves. If you need help quitting, ask your health care provider.  If you have diabetes, work  closely with your health care provider to keep your blood sugar under control.  If you have numbness in your feet: ? Check every day for signs of injury or infection. Watch for redness, warmth, and swelling. ? Wear padded socks and comfortable shoes. These help protect your feet.  Keep all follow-up visits as told by your health care provider. This is important. Contact a health care provider if you:  Have paresthesia that gets worse or does not go away.  Have a burning or prickling feeling that gets worse when you walk.  Have pain, cramps, or dizziness.  Develop a rash. Get help right away if you:  Feel weak.  Have trouble walking or moving.  Have problems with speech, understanding, or vision.  Feel confused.  Cannot control your bladder or bowel movements.  Have numbness after an injury.  Develop new weakness in an arm or leg.  Faint. Summary  Paresthesia is an abnormal burning or prickling sensation that is usually felt in the hands, arms, legs, or feet. It may also occur in other parts of the body.  Paresthesia may occur without any clear cause, or it may be caused by breathing too quickly (hyperventilation), pressure on a nerve, an underlying medical condition, side effects of a medication, nutritional deficiencies, or exposure to toxic chemicals.  If you have paresthesia that lasts a long time, you may need to be evaluated by your health care provider. This information is not intended to replace advice given to you by   your health care provider. Make sure you discuss any questions you have with your health care provider. Document Revised: 03/10/2018 Document Reviewed: 02/21/2017 Elsevier Patient Education  2020 [place].  Prediabetes Eating Plan Prediabetes is a condition that causes blood sugar (glucose) levels to be higher than normal. This increases the risk for developing diabetes. In order to prevent diabetes from developing, your health care provider may  recommend a diet and other lifestyle changes to help you:  Control your blood glucose levels.  Improve your cholesterol levels.  Manage your blood pressure. Your health care provider may recommend working with a diet and nutrition specialist (dietitian) to make a meal plan that is best for you. What are tips for following this plan? Lifestyle  Set weight loss goals with the help of your health care team. It is recommended that most people with prediabetes lose 7% of their current body weight.  Exercise for at least 30 minutes at least 5 days a week.  Attend a support group or seek ongoing support from a mental health counselor.  Take over-the-counter and prescription medicines only as told by your health care provider. Reading food labels  Read food labels to check the amount of fat, salt (sodium), and sugar in prepackaged foods. Avoid foods that have: ? Saturated fats. ? Trans fats. ? Added sugars.  Avoid foods that have more than 300 milligrams (mg) of sodium per serving. Limit your daily sodium intake to less than 2,300 mg each day. Shopping  Avoid buying pre-made and processed foods. Cooking  Cook with olive oil. Do not use butter, lard, or ghee.  Bake, broil, grill, or boil foods. Avoid frying. Meal planning   Work with your dietitian to develop an eating plan that is right for you. This may include: ? Tracking how many calories you take in. Use a food diary, notebook, or mobile application to track what you eat at each meal. ? Using the glycemic index (GI) to plan your meals. The index tells you how quickly a food will raise your blood glucose. Choose low-GI foods. These foods take a longer time to raise blood glucose.  Consider following a Mediterranean diet. This diet includes: ? Several servings each day of fresh fruits and vegetables. ? Eating fish at least twice a week. ? Several servings each day of whole grains, beans, nuts, and seeds. ? Using olive oil  instead of other fats. ? Moderate alcohol consumption. ? Eating small amounts of red meat and whole-fat dairy.  If you have high blood pressure, you may need to limit your sodium intake or follow a diet such as the DASH eating plan. DASH is an eating plan that aims to lower high blood pressure. What foods are recommended? The items listed below may not be a complete list. Talk with your dietitian about what dietary choices are best for you. Grains Whole grains, such as whole-wheat or whole-grain breads, crackers, cereals, and pasta. Unsweetened oatmeal. Bulgur. Barley. Quinoa. Brown rice. Corn or whole-wheat flour tortillas or taco shells. Vegetables Lettuce. Spinach. Peas. Beets. Cauliflower. Cabbage. Broccoli. Carrots. Tomatoes. Squash. Eggplant. Herbs. Peppers. Onions. Cucumbers. Brussels sprouts. Fruits Berries. Bananas. Apples. Oranges. Grapes. Papaya. Mango. Pomegranate. Kiwi. Grapefruit. Cherries. Meats and other protein foods Seafood. Poultry without skin. Lean cuts of pork and beef. Tofu. Eggs. Nuts. Beans. Dairy Low-fat or fat-free dairy products, such as yogurt, cottage cheese, and cheese. Beverages Water. Tea. Coffee. Sugar-free or diet soda. Seltzer water. Lowfat or no-fat milk. Milk alternatives, such as soy  or almond milk. Fats and oils Olive oil. Canola oil. Sunflower oil. Grapeseed oil. Avocado. Walnuts. Sweets and desserts Sugar-free or low-fat pudding. Sugar-free or low-fat ice cream and other frozen treats. Seasoning and other foods Herbs. Sodium-free spices. Mustard. Relish. Low-fat, low-sugar ketchup. Low-fat, low-sugar barbecue sauce. Low-fat or fat-free mayonnaise. What foods are not recommended? The items listed below may not be a complete list. Talk with your dietitian about what dietary choices are best for you. Grains Refined white flour and flour products, such as bread, pasta, snack foods, and cereals. Vegetables Canned vegetables. Frozen vegetables with  butter or cream sauce. Fruits Fruits canned with syrup. Meats and other protein foods Fatty cuts of meat. Poultry with skin. Breaded or fried meat. Processed meats. Dairy Full-fat yogurt, cheese, or milk. Beverages Sweetened drinks, such as sweet iced tea and soda. Fats and oils Butter. Lard. Ghee. Sweets and desserts Baked goods, such as cake, cupcakes, pastries, cookies, and cheesecake. Seasoning and other foods Spice mixes with added salt. Ketchup. Barbecue sauce. Mayonnaise. Summary  To prevent diabetes from developing, you may need to make diet and other lifestyle changes to help control blood sugar, improve cholesterol levels, and manage your blood pressure.  Set weight loss goals with the help of your health care team. It is recommended that most people with prediabetes lose 7 percent of their current body weight.  Consider following a Mediterranean diet that includes plenty of fresh fruits and vegetables, whole grains, beans, nuts, seeds, fish, lean meat, low-fat dairy, and healthy oils. This information is not intended to replace advice given to you by your health care provider. Make sure you discuss any questions you have with your health care provider. Document Revised: 06/06/2018 Document Reviewed: 04/18/2016 Elsevier Patient Education  2020 [employer].

## 2020-02-17 NOTE — Progress Notes (Signed)
Patient presenting with pain/tingling in the right side face, neck, and back of the head. States this started a week an a half ago. At times when turning his head it feels like a sharp pain. Starts off with tinging in the right side face and then down in to the next.   Patient does sleep sitting up in the break room and in his car when on breaks at work.

## 2020-02-22 LAB — ANTI-NUCLEAR AB-TITER (ANA TITER): ANA Titer 1: 1:40 {titer} — ABNORMAL HIGH

## 2020-02-22 LAB — ANA: Anti Nuclear Antibody (ANA): POSITIVE — AB

## 2020-03-07 ENCOUNTER — Ambulatory Visit: Payer: BC Managed Care – PPO | Admitting: Family

## 2020-04-01 ENCOUNTER — Ambulatory Visit: Payer: BC Managed Care – PPO | Admitting: Family

## 2020-06-08 ENCOUNTER — Telehealth: Payer: Self-pay

## 2020-06-08 DIAGNOSIS — R062 Wheezing: Secondary | ICD-10-CM

## 2020-06-08 MED ORDER — ALBUTEROL SULFATE HFA 108 (90 BASE) MCG/ACT IN AERS: 2.0000 | INHALATION_SPRAY | RESPIRATORY_TRACT | 0 refills | Status: DC | PRN

## 2020-06-08 NOTE — Telephone Encounter (Signed)
Patient has been informed.

## 2020-06-08 NOTE — Telephone Encounter (Signed)
Pt needs a refill on albuterol (VENTOLIN HFA) 108 (90 Base) MCG/ACT inhaler. Pt was actively coughing on the phone. He wants a call when this has been called in. He does have an appt scheduled for 07/27/20 and is not able to come in before then due to his job. Please send to [facility] on [address]

## 2020-07-05 ENCOUNTER — Other Ambulatory Visit: Payer: Self-pay | Admitting: Family

## 2020-07-05 DIAGNOSIS — R062 Wheezing: Secondary | ICD-10-CM

## 2020-07-27 ENCOUNTER — Ambulatory Visit: Payer: BC Managed Care – PPO | Admitting: Family

## 2020-08-22 IMAGING — US US ABDOMEN COMPLETE W/ ELASTOGRAPHY
1 series · 13 of 25 positions shown · non-contrast
Comparison: 06/01/2015

CLINICAL DATA: Liver fibrosis, hepatitis



[Series 1: us abdomen complete w/ elastography · 0.31mm/px · 13 of 97 slices shown]
[im 1/97]
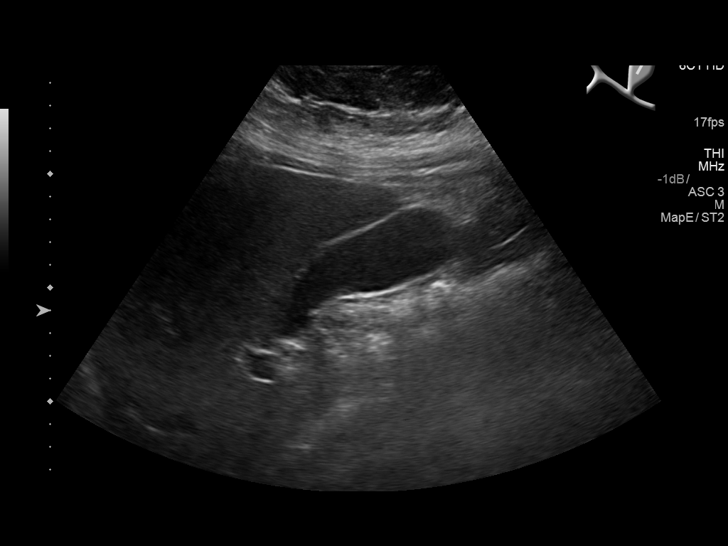
[im 9/97]
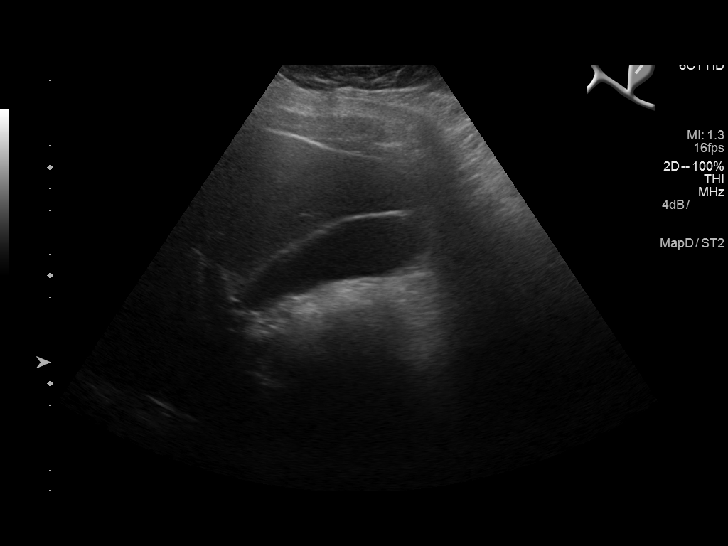
[im 17/97]
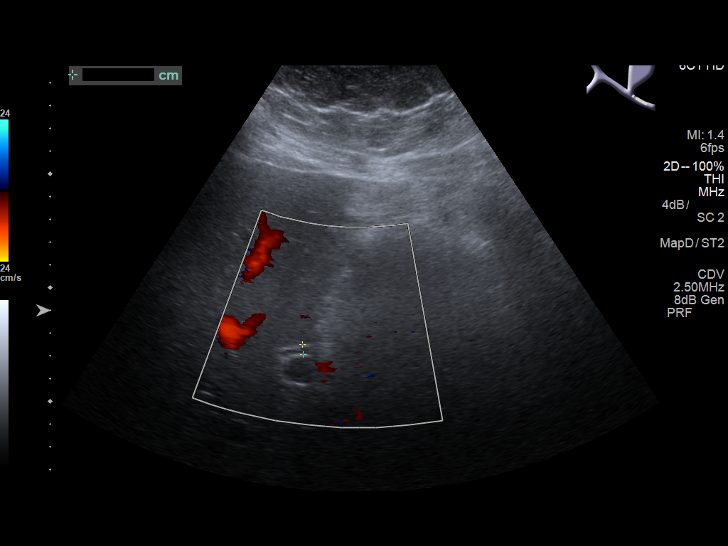
[im 25/97]
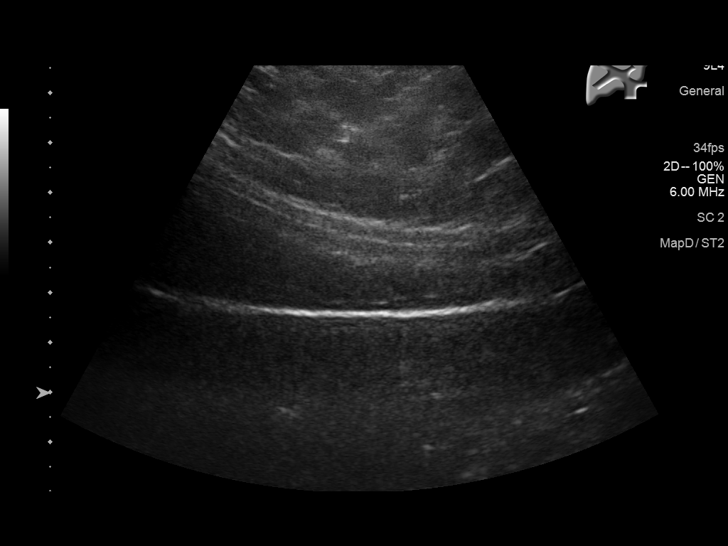
[im 33/97]
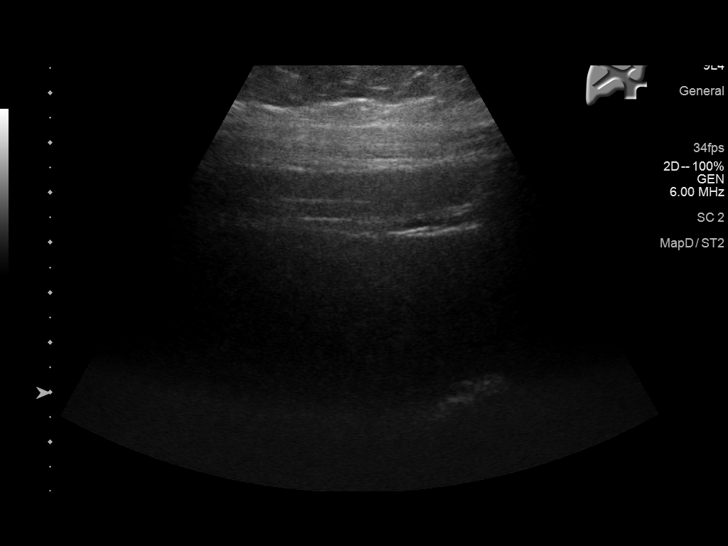
[im 41/97]
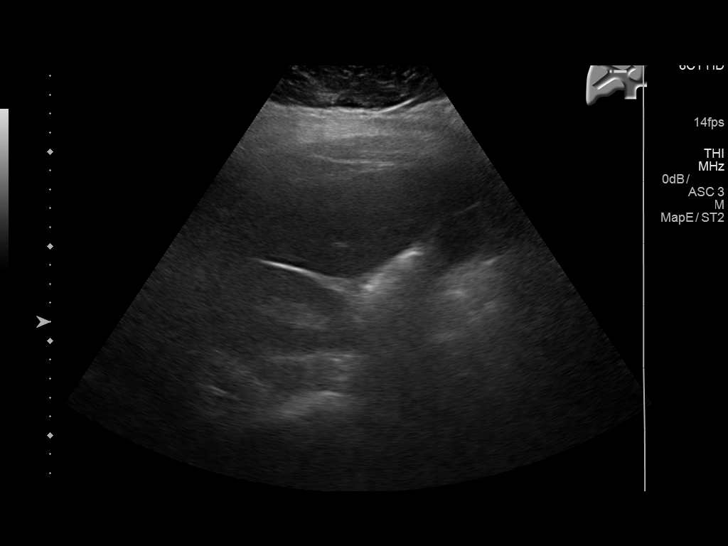
[im 49/97]
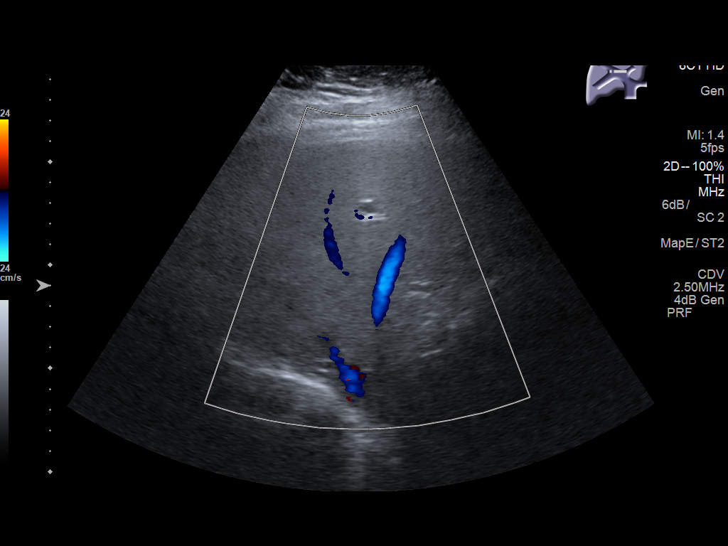
[im 57/97]
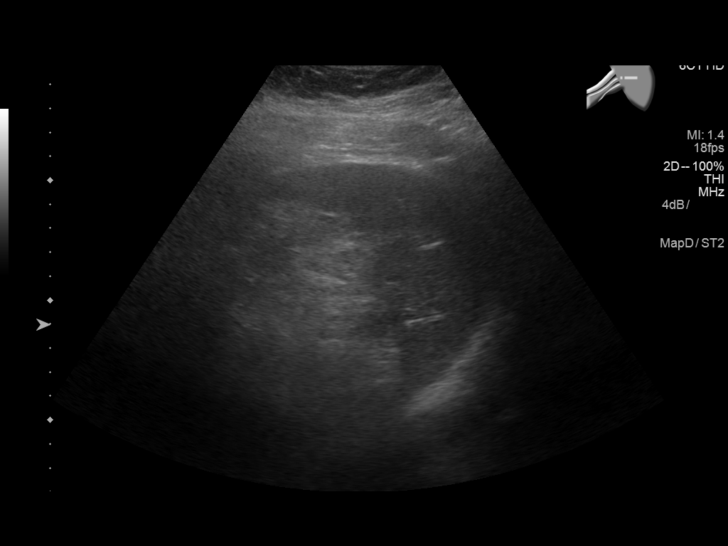
[im 65/97]
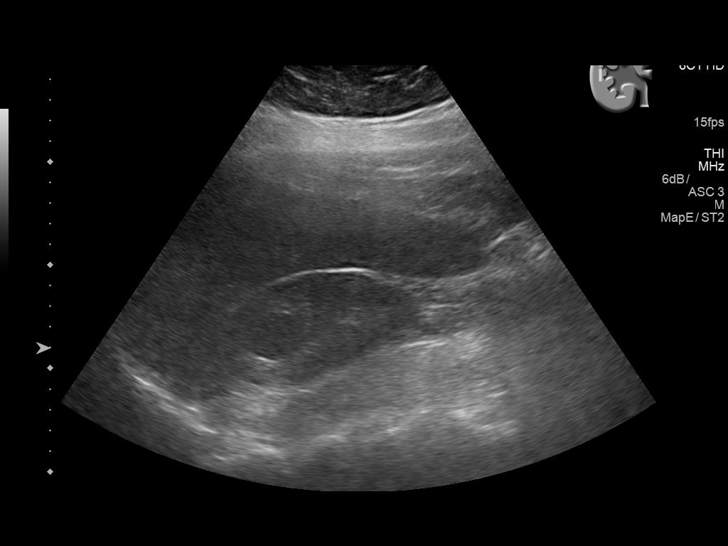
[im 73/97]
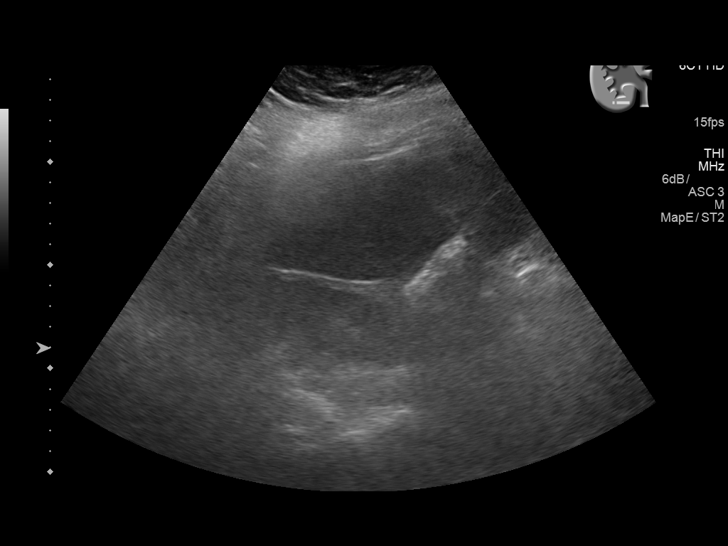
[im 81/97]
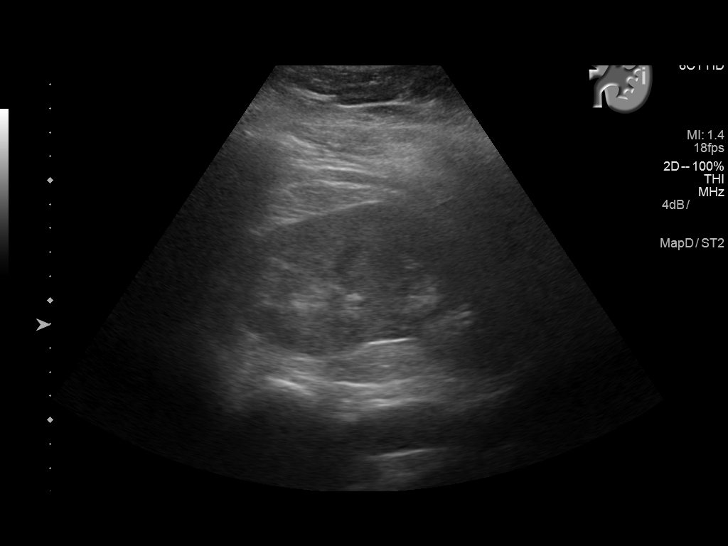
[im 89/97]
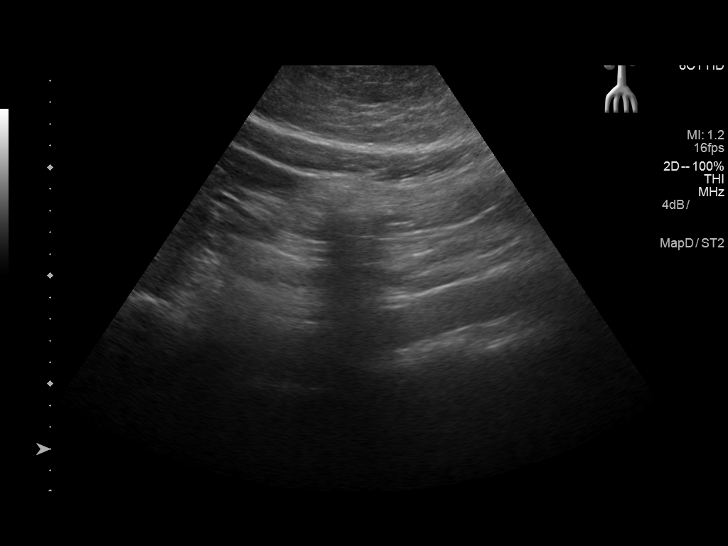
[im 97/97]
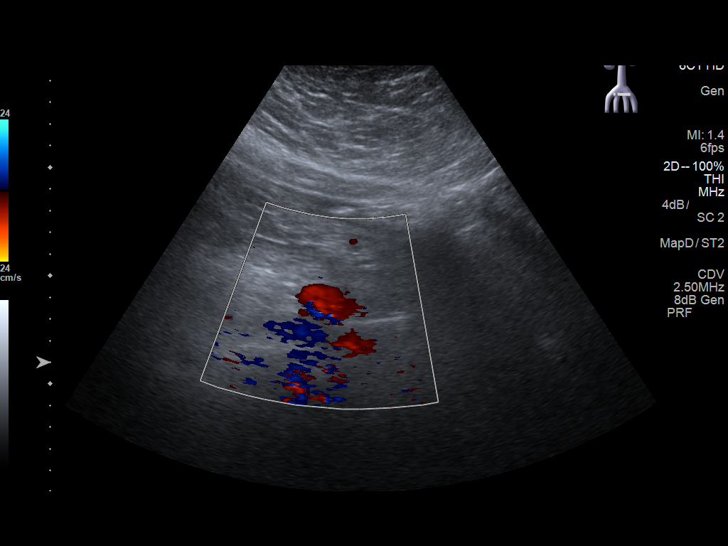

[13 of 25 positions shown; findings below may reference images not displayed]

FINDINGS: ULTRASOUND ABDOMEN

Gallbladder: No gallstones or wall thickening visualized. No
sonographic Murphy sign noted by sonographer.

Common bile duct: Diameter: Normal caliber, 4 mm

Liver: Heterogeneous, increased echotexture throughout the liver
compatible with fatty infiltration. Focal 2.4 cm hyperechoic area in
the left lobe, likely hemangioma. Portal vein is patent on color
Doppler imaging with normal direction of blood flow towards the
liver.

IVC: No abnormality visualized.

Pancreas: Visualized portion unremarkable.

Spleen: Size and appearance within normal limits.

Right Kidney: Length: 12.2 cm. 2.4 cm cyst in the mid to upper pole
centrally. Echogenicity within normal limits. No mass or
hydronephrosis visualized.

Left Kidney: Length: 13.9 cm. Echogenicity within normal limits. No
mass or hydronephrosis visualized.

Abdominal aorta: No aneurysm visualized.

Other findings: None.

ULTRASOUND HEPATIC ELASTOGRAPHY

Device: Siemens Helix VTQ

Patient position: Left lateral decubitus

Transducer DEGUCHI

Number of measurements: 10

Hepatic segment:  8

Median velocity:   1.11 m/sec

IQR:

IQR/Median velocity ratio:

Corresponding Metavir fibrosis score:  F0/F1

Risk of fibrosis: Minimal

Limitations of exam: None

Please note that abnormal shear wave velocities may also be
identified in clinical settings other than with hepatic fibrosis,
such as: acute hepatitis, elevated right heart and central venous
pressures including use of beta blockers, Hincapie disease
([place]), infiltrative processes such as
mastocytosis/amyloidosis/infiltrative tumor, extrahepatic
cholestasis, in the post-prandial state, and liver transplantation.
Correlation with patient history, laboratory data, and clinical
condition recommended.
IMPRESSION: ULTRASOUND ABDOMEN: Fatty infiltration of the liver.

2.4 cm hyperechoic lesion within the posterior left hepatic lobe,
likely hemangioma.

ULTRASOUND HEPATIC ELASTOGRAPHY:

Median hepatic shear wave velocity is calculated at 1.11 m/sec.

Corresponding Metavir fibrosis score is F0/F1.

Risk of fibrosis is Minimal.

Follow-up: None required

## 2020-09-05 ENCOUNTER — Telehealth: Payer: Self-pay | Admitting: Family

## 2020-09-05 NOTE — Telephone Encounter (Signed)
Pt called asking to speak with you about the referral for his MRI and his insurance-please advise

## 2020-09-05 NOTE — Telephone Encounter (Signed)
err

## 2020-09-27 ENCOUNTER — Ambulatory Visit: Payer: BC Managed Care – PPO | Admitting: Family

## 2020-10-23 DIAGNOSIS — Z20822 Contact with and (suspected) exposure to covid-19: Secondary | ICD-10-CM | POA: Diagnosis not present

## 2020-11-01 NOTE — Telephone Encounter (Signed)
Pt called back about MRI brain referral. He states that the insurance has been straightened out and he needs this active again. Please advise

## 2020-11-07 ENCOUNTER — Emergency Department: Payer: BC Managed Care – PPO

## 2020-11-07 ENCOUNTER — Emergency Department
Admission: EM | Admit: 2020-11-07 | Discharge: 2020-11-07 | Disposition: A | Discharge status: Home / Self Care | Payer: BC Managed Care – PPO | Attending: Emergency Medicine | Admitting: Emergency Medicine

## 2020-11-07 ENCOUNTER — Other Ambulatory Visit: Payer: Self-pay

## 2020-11-07 DIAGNOSIS — Z87891 Personal history of nicotine dependence: Secondary | ICD-10-CM | POA: Diagnosis not present

## 2020-11-07 DIAGNOSIS — R0989 Other specified symptoms and signs involving the circulatory and respiratory systems: Secondary | ICD-10-CM | POA: Insufficient documentation

## 2020-11-07 DIAGNOSIS — R0602 Shortness of breath: Secondary | ICD-10-CM | POA: Insufficient documentation

## 2020-11-07 DIAGNOSIS — J45909 Unspecified asthma, uncomplicated: Secondary | ICD-10-CM | POA: Diagnosis not present

## 2020-11-07 DIAGNOSIS — R42 Dizziness and giddiness: Secondary | ICD-10-CM | POA: Insufficient documentation

## 2020-11-07 NOTE — Discharge Instructions (Signed)
Drink plenty of water to stay well-hydrated and follow up with ENT for further evaluation of these recurrent symptoms.

## 2020-11-07 NOTE — ED Triage Notes (Signed)
Pt to ED for dizziness that started this am. States was coughing this am because felt like hair was stuck in throat and became shob then dizzy. Denies shob at this time. RR even and unlabored Covid 2 weeks ago

## 2020-11-07 NOTE — ED Notes (Signed)
Pt transported to xray 

## 2020-11-07 NOTE — ED Provider Notes (Signed)
[facility] Emergency Department Provider Note  ____________________________________________  Time seen: Approximately 8:40 AM  I have reviewed the triage vital signs and the nursing notes.   HISTORY  Chief Complaint Dizziness    HPI Seth Simmons is a 50 y.o. male with a past history of asthma who reports being in his usual state of health, getting ready for work this morning when he started to have a foreign body sensation in his throat that felt like a piece of hair.  He tried to cough forcefully a few times to remove it, after which he had a feeling of his throat closing up.  He felt short of breath and dizzy during that time.  He stayed still and concentrated on taking a few deep breaths, and the shortness of breath and foreign body sensation resolved.  He checked his blood pressure at that time and found it to be 200/120.  His still feels slightly dizzy but otherwise has essentially returned back to normal.  He reports that the symptoms are recurrent, happening every 1 to 2 months.  He has not seen a doctor for the symptoms previously.  Denies chest pain or exertional symptoms.  No pleuritic symptoms.  No new ingestions or exposures.  Past Medical History:  Diagnosis Date   Anemia    Arthritis    Asthma    Genital warts    Hepatitis    Obesity      Patient Active Problem List   Diagnosis Date Noted   Encounter for screening colonoscopy 12/24/2019   Screen for STD (sexually transmitted disease) 12/02/2019   Genital warts    Dizziness    Liver fibrosis 09/24/2018   OSA (obstructive sleep apnea) 09/24/2018   Fever 09/17/2018   Snores 06/27/2018   Wheezing 06/13/2018   Prediabetes 12/11/2017   Palpitations 10/11/2017   Routine physical examination 10/11/2017   Herpes simplex type 2 infection 08/18/2014   Morbid obesity ([place]) 08/18/2014   Arthritis of knee, degenerative 08/18/2014   Hepatitis C virus infection without hepatic coma 08/12/2014    Liver lesion, left lobe 08/12/2014     Past Surgical History:  Procedure Laterality Date   COLONOSCOPY WITH PROPOFOL N/A 01/28/2020   Procedure: COLONOSCOPY WITH PROPOFOL;  Surgeon: [name], MD;  Location: [facility] ENDOSCOPY;  Service: Gastroenterology;  Laterality: N/A;   ESOPHAGOGASTRODUODENOSCOPY (EGD) WITH PROPOFOL N/A 10/21/2014   Procedure: ESOPHAGOGASTRODUODENOSCOPY (EGD) WITH PROPOFOL;  Surgeon: Josefine Class, MD;  Location: [facility] ENDOSCOPY;  Service: Endoscopy;  Laterality: N/A;   HIP SURGERY Left      Prior to Admission medications   Medication Sig Start Date End Date Taking? Authorizing Provider  albuterol (VENTOLIN HFA) 108 (90 Base) MCG/ACT inhaler INHALE 2 PUFFS INTO THE LUNGS EVERY 4 HOURS AS NEEDED FOR WHEEZING OR SHORTNESS OF BREATH. 07/05/20   Burnard Hawthorne, FNP  cetirizine (ZYRTEC) 10 MG tablet Take 1 tablet (10 mg total) by mouth daily. 06/13/18   Burnard Hawthorne, FNP  cholecalciferol (VITAMIN D3) 25 MCG (1000 UT) tablet Take 1,000 Units by mouth daily.    [provider]  Cholecalciferol 1.25 MG (50000 UT) capsule Take 1 capsule (50,000 Units total) by mouth once a week. 02/17/20   McLean-Scocuzza, Nino Glow, MD  cyclobenzaprine (FLEXERIL) 5 MG tablet Take 1-2 tablets (5-10 mg total) by mouth at bedtime as needed for muscle spasms. 02/17/20   McLean-Scocuzza, Nino Glow, MD  metroNIDAZOLE (FLAGYL) 500 MG tablet Take by mouth. 12/04/19   [provider]  omeprazole (PRILOSEC) 20 MG capsule Take 1 capsule (20 mg total) by mouth daily. 11/19/19   Faustino Congress, NP     Allergies Patient has no known allergies.   Family History  Problem Relation Age of Onset   Diabetes Mother    Diabetes Father    Diabetes Brother    Cancer Brother    Diabetes Brother    Colon cancer Neg Hx     Social History Social History   Tobacco Use   Smoking status: Former    Types: Cigars   Smokeless tobacco: Never   Tobacco comments:    maybe 1 cigar  every 3 months   Substance Use Topics   Alcohol use: Yes    Alcohol/week: 0.0 standard drinks    Comment: weekends;    Drug use: No    Review of Systems  Constitutional:   No fever or chills.  ENT:   Positive throat foreign body sensation.  No rhinorrhea. Cardiovascular:   No chest pain or syncope. Respiratory:   Positive shortness of breath without cough. Gastrointestinal:   Negative for abdominal pain, vomiting and diarrhea.  Musculoskeletal:   Negative for focal pain or swelling All other systems reviewed and are negative except as documented above in ROS and HPI.  ____________________________________________   PHYSICAL EXAM:  VITAL SIGNS: ED Triage Vitals  Enc Vitals Group     BP 11/07/20 0816 122/80     Pulse Rate 11/07/20 0816 70     Resp 11/07/20 0816 20     Temp 11/07/20 0816 98.4 F (36.9 C)     Temp Source 11/07/20 0816 Oral     SpO2 11/07/20 0816 100 %     Weight 11/07/20 0814 (!) 332 lb (150.6 kg)     Height 11/07/20 0814 '6\' 4"'$  (1.93 m)     Head Circumference --      Peak Flow --      Pain Score 11/07/20 0814 0     Pain Loc --      Pain Edu? --      Excl. in [place]? --     Vital signs reviewed, nursing assessments reviewed.   Constitutional:   Alert and oriented. Non-toxic appearance. Eyes:   Conjunctivae are normal. EOMI. PERRL. ENT      Head:   Normocephalic and atraumatic.      Nose:   Normal.      Mouth/Throat:   Normal.  No tonsillar or uvular enlargement.  Mallampati grade 1.      Neck:   No meningismus. Full ROM.  No mass or tenderness Hematological/Lymphatic/Immunilogical:   No cervical lymphadenopathy. Cardiovascular:   RRR. Symmetric bilateral radial and DP pulses.  No murmurs. Cap refill less than 2 seconds. Respiratory:   Normal respiratory effort without tachypnea/retractions. Breath sounds are clear and equal bilaterally. No wheezes/rales/rhonchi. Gastrointestinal:   Soft and nontender. Non distended. There is no CVA tenderness.  No  rebound, rigidity, or guarding. Genitourinary:   deferred Musculoskeletal:   Normal range of motion in all extremities. No joint effusions.  No lower extremity tenderness.  No edema. Neurologic:   Normal speech and language.  Motor grossly intact. No acute focal neurologic deficits are appreciated.  Skin:    Skin is warm, dry and intact. No rash noted.  No petechiae, purpura, or bullae.  ____________________________________________    LABS (pertinent positives/negatives) (all labs ordered are listed, but only abnormal results are displayed) Labs Reviewed - No data to display ____________________________________________   EKG  Interpreted by me  Date: 11/07/2020  Rate: 75  Rhythm: normal sinus rhythm  QRS Axis: normal  Intervals: normal  ST/T Wave abnormalities: normal  Conduction Disutrbances: none  Narrative Interpretation: unremarkable     ____________________________________________    RADIOLOGY  DG Neck Soft Tissue  Result Date: 11/07/2020 CLINICAL DATA:  Foreign body sensation in the throat. EXAM: NECK SOFT TISSUES - 1+ VIEW COMPARISON:  None. FINDINGS: There is no evidence of retropharyngeal soft tissue swelling or epiglottic enlargement. The cervical airway is unremarkable and no radio-opaque foreign body identified. IMPRESSION: Negative. Electronically Signed   By: Misty Stanley M.D.   On: 11/07/2020 08:59    ____________________________________________   PROCEDURES Procedures  ____________________________________________  DIFFERENTIAL DIAGNOSIS   Throat foreign body, vagal reaction, bronchospasm, epiglottitis  CLINICAL IMPRESSION / ASSESSMENT AND PLAN / ED COURSE  Medications ordered in the ED: Medications - No data to display  Pertinent labs & imaging results that were available during my care of the patient were reviewed by me and considered in my medical decision making (see chart for details).  Seth Simmons was evaluated in Emergency  Department on 11/07/2020 for the symptoms described in the history of present illness. He was evaluated in the context of the global COVID-19 pandemic, which necessitated consideration that the patient might be at risk for infection with the SARS-CoV-2 virus that causes COVID-19. Institutional protocols and algorithms that pertain to the evaluation of patients at risk for COVID-19 are in a state of rapid change based on information released by regulatory bodies including the CDC and federal and state organizations. These policies and algorithms were followed during the patient's care in the ED.   Patient presents with a feeling of throat foreign body which progressed to transient shortness of breath.  He is now feeling much better and this was rapid onset from his usual baseline state of health.  Vital signs are normal.  Exam is unremarkable with normal breath sounds and normal upper airway.  Will obtain lateral neck x-ray, recommend ENT evaluation outpatient due to recurrent symptoms.   ----------------------------------------- 9:54 AM on 11/07/2020 ----------------------------------------- Neck x-ray viewed and interpreted by me, appears normal without visible foreign body or airway swelling.  Radiology report reviewed.  Patient reports his symptoms have resolved.  Counseled on follow-up     ____________________________________________   FINAL CLINICAL IMPRESSION(S) / ED DIAGNOSES    Final diagnoses:  Dizziness  Foreign body sensation in throat     ED Discharge Orders     None       Portions of this note were generated with dragon dictation software. Dictation errors may occur despite best attempts at proofreading.    Carrie Mew, MD 11/07/20 [id number]

## 2020-11-08 ENCOUNTER — Other Ambulatory Visit: Payer: Self-pay | Admitting: Family

## 2020-11-08 ENCOUNTER — Encounter: Payer: Self-pay | Admitting: Family

## 2020-11-08 DIAGNOSIS — B009 Herpesviral infection, unspecified: Secondary | ICD-10-CM

## 2020-11-08 DIAGNOSIS — R42 Dizziness and giddiness: Secondary | ICD-10-CM

## 2020-11-08 NOTE — Telephone Encounter (Signed)
The MR of brain was ordered by Dr. Olivia Mackie 01/2020. Pt went to ED yesterday for dizziness it appears.

## 2020-11-08 NOTE — Telephone Encounter (Signed)
Patient called again about his referral for a MRI. He has not heard from office.

## 2020-11-09 DIAGNOSIS — J385 Laryngeal spasm: Secondary | ICD-10-CM | POA: Diagnosis not present

## 2020-11-09 DIAGNOSIS — K219 Gastro-esophageal reflux disease without esophagitis: Secondary | ICD-10-CM | POA: Diagnosis not present

## 2020-11-09 DIAGNOSIS — G4733 Obstructive sleep apnea (adult) (pediatric): Secondary | ICD-10-CM | POA: Diagnosis not present

## 2020-11-15 ENCOUNTER — Telehealth: Payer: Self-pay | Admitting: Family

## 2020-11-15 DIAGNOSIS — M545 Low back pain, unspecified: Secondary | ICD-10-CM

## 2020-11-15 NOTE — Telephone Encounter (Signed)
Patient is having an MRI done on 9/27 for a separate issue and would like to know if he can have an MRI order added for his lower back that has been causing him pain recently.Please advise.

## 2020-11-22 ENCOUNTER — Other Ambulatory Visit: Payer: Self-pay

## 2020-11-22 ENCOUNTER — Ambulatory Visit
Admission: RE | Admit: 2020-11-22 | Discharge: 2020-11-22 | Disposition: A | Discharge status: Home / Self Care | Payer: BC Managed Care – PPO | Source: Ambulatory Visit | Attending: Family | Admitting: Family

## 2020-11-22 DIAGNOSIS — R202 Paresthesia of skin: Secondary | ICD-10-CM | POA: Diagnosis not present

## 2020-11-22 DIAGNOSIS — R42 Dizziness and giddiness: Secondary | ICD-10-CM

## 2020-11-22 DIAGNOSIS — R55 Syncope and collapse: Secondary | ICD-10-CM | POA: Diagnosis not present

## 2020-11-22 MED ORDER — GADOBUTROL 1 MMOL/ML IV SOLN
10.0000 mL | Freq: Once | INTRAVENOUS | Status: AC | PRN
  Administered 2020-11-22: 10 mL via INTRAVENOUS

## 2020-11-22 NOTE — Telephone Encounter (Signed)
I called patient as he was on his was to MRI today. He was understanding that one for Lumbar spine was just added today. He does have an appointment scheduled with you 10/12.

## 2020-11-22 NOTE — Telephone Encounter (Signed)
Call patient I have not examined or discussed low back pain with patient.  I typically do not order imaging and until discussed /examined patient so that I can order appropriate imaging to order.  I have ordered MRI lumbar spine but it may be too late add-on for today.  Please advise to make appointment with me to discuss low back pain.  MRI lumbar spine is in process however he must have an appointment with me to discuss his pain Let us know if you dont hear back within a week in regards to an appointment being scheduled.

## 2020-12-07 ENCOUNTER — Encounter: Payer: Self-pay | Admitting: Family

## 2020-12-07 ENCOUNTER — Telehealth: Payer: Self-pay

## 2020-12-07 ENCOUNTER — Other Ambulatory Visit: Payer: Self-pay

## 2020-12-07 ENCOUNTER — Ambulatory Visit (INDEPENDENT_AMBULATORY_CARE_PROVIDER_SITE_OTHER): Payer: BC Managed Care – PPO | Admitting: Family

## 2020-12-07 VITALS — BP 112/72 | HR 71 | Temp 98.5°F | Ht 76.0 in | Wt 331.6 lb

## 2020-12-07 DIAGNOSIS — M545 Low back pain, unspecified: Secondary | ICD-10-CM

## 2020-12-07 DIAGNOSIS — K7400 Hepatic fibrosis, unspecified: Secondary | ICD-10-CM | POA: Diagnosis not present

## 2020-12-07 DIAGNOSIS — T17308A Unspecified foreign body in larynx causing other injury, initial encounter: Secondary | ICD-10-CM | POA: Insufficient documentation

## 2020-12-07 DIAGNOSIS — R42 Dizziness and giddiness: Secondary | ICD-10-CM

## 2020-12-07 DIAGNOSIS — T17308D Unspecified foreign body in larynx causing other injury, subsequent encounter: Secondary | ICD-10-CM | POA: Diagnosis not present

## 2020-12-07 MED ORDER — FAMOTIDINE 20 MG PO TABS: 20.0000 mg | ORAL_TABLET | Freq: Every day | ORAL | 1 refills | Status: DC

## 2020-12-07 NOTE — Assessment & Plan Note (Addendum)
Overall improved with the addition of omeprazole 40 mg taken once daily. Considering GERD as primary etiology.   Advised that he may start taking this in the morning prior to breakfast we will adjunct with Pepcid AC take it at night.  Encouraged him to wait 2 hours after his last meal of the day prior to laying down to sleep.  He has seen ENT.  Advised ultrasound of thyroid showed sure this has no bearing on choking.  Patient politely declines at this time.  Referral to GI for further evaluation, discussion of EGD in the setting of suspected GERD and also for follow-up for liver fibrosis.  He has follow-up with ENT next month, and I emphasized the importance of discussing left maxillary retention cyst as seen on MRI as it relates to surveillance or treatment.  He verbalized understanding.  Close follow-up

## 2020-12-07 NOTE — Patient Instructions (Addendum)
Please discuss sinus cysts with Dr Maris Berger, ENT  Uses NSAIDs such as Aleve sparingly.  Please start Pepcid AC.  Referral to gastroenterology, Dr. Vicente Males Let us know if you dont hear back within a week in regards to an appointment being scheduled.

## 2020-12-07 NOTE — Assessment & Plan Note (Addendum)
Consistent with SI joint dysfunction.  Advised to limit NSAID use in the setting of suspected acid reflux ( ? Gastritis).  Discussed Cymbalta and would like to start Cymbalta however in history of liver fibrosis, I would like to consult with GI first as he is over due.  Referral to GI has been placed.  Trial of gabapentin 100 mg 3 times daily for now.  We also may consider tramadol.  declines PT

## 2020-12-07 NOTE — Telephone Encounter (Signed)
Were you going to potentially try tramadol for patient's back pain? He was wanting relief & wasn't sure if you were going to send?

## 2020-12-07 NOTE — Progress Notes (Signed)
Subjective:    Patient ID: Seth Simmons, male    DOB: [birth year], 50 y.o.   MRN: [id number]  CC: Seth Simmons is a 50 y.o. male who presents today for follow up.   HPI: Accompanied by wife today  Low back pain , middle to left side, for 3 years, off and on.  Describes dull pain which is always there. Pain worse with long periods of standing or bending.  Taking aleve qd with relief. No relief with salon pas pain patch.  No numbness in legs, groin pain No falls.  He is able urinate and have bowel movements normally. No h/o cancer.   No h/o back surgery, renal stone.   No h/o drug overduse.   No drug use.   No h/o seizure.    Foreign sensation has improved. Choking sensation or 'lump' is less frequent.  Not sure if prilosec is helpful. He works until [employer] daily. Rare caffeine. Alcohol on the weekends.  No fever, abdominal pain, weight loss, pain when swallowing.   Dizziness has improved. Had been provoked randomly such as walking, bend over or gets up too fast. No syncope, congestion, cp, sob, vertigo, tinnitus.  Trying to drink more water.   Presented to ED 11/07/2020 for dizziness as well as foreign body sensation in his throat DG neck soft tissue no evidence of retropharyngeal soft tissue swelling, the cervical airways unremarkable and no radiopaque foreign body identified ENT consult with Dr Maris Berger for foreign body sensation and told he had GERD, started prilosec 40mg  in the evening. Eats late around 1pm.  (Unable to see these notes in epic).  Reports follow-up is scheduled for next month with ENT. MRI brain 11/22/2020 unremarkable without evidence of acute intracranial abnormality.  He does have 18 mm mucous retention cyst left sinus Carotid ultrasound performed 11/22/2020 which is negative for significant stenosis Colonoscopy 01/2020 EGD 09/2014 normal esophagus   HISTORY:  Past Medical History:  Diagnosis Date   Anemia    Arthritis    Asthma    Genital warts     Hepatitis    Obesity    Past Surgical History:  Procedure Laterality Date   COLONOSCOPY WITH PROPOFOL N/A 01/28/2020   Procedure: COLONOSCOPY WITH PROPOFOL;  Surgeon: [name], MD;  Location: [facility] ENDOSCOPY;  Service: Gastroenterology;  Laterality: N/A;   ESOPHAGOGASTRODUODENOSCOPY (EGD) WITH PROPOFOL N/A 10/21/2014   Procedure: ESOPHAGOGASTRODUODENOSCOPY (EGD) WITH PROPOFOL;  Surgeon: Josefine Class, MD;  Location: [facility] ENDOSCOPY;  Service: Endoscopy;  Laterality: N/A;   HIP SURGERY Left    Family History  Problem Relation Age of Onset   Diabetes Mother    Diabetes Father    Diabetes Brother    Cancer Brother    Diabetes Brother    Colon cancer Neg Hx     Allergies: Patient has no known allergies. Current Outpatient Medications on File Prior to Visit  Medication Sig Dispense Refill   albuterol (VENTOLIN HFA) 108 (90 Base) MCG/ACT inhaler INHALE 2 PUFFS INTO THE LUNGS EVERY 4 HOURS AS NEEDED FOR WHEEZING OR SHORTNESS OF BREATH. 6.7 each 0   cetirizine (ZYRTEC) 10 MG tablet Take 1 tablet (10 mg total) by mouth daily. 30 tablet 11   cholecalciferol (VITAMIN D3) 25 MCG (1000 UT) tablet Take 1,000 Units by mouth daily.     Cholecalciferol 1.25 MG (50000 UT) capsule Take 1 capsule (50,000 Units total) by mouth once a week. 13 capsule 1   cyclobenzaprine (FLEXERIL) 5 MG tablet Take 1-2 tablets (5-10 mg  total) by mouth at bedtime as needed for muscle spasms. 60 tablet 2   omeprazole (PRILOSEC) 40 MG capsule Take 1 capsule by mouth daily.     No current facility-administered medications on file prior to visit.    Social History   Tobacco Use   Smoking status: Former    Types: Cigars   Smokeless tobacco: Never   Tobacco comments:    maybe 1 cigar every 3 months   Substance Use Topics   Alcohol use: Yes    Alcohol/week: 0.0 standard drinks    Comment: weekends;    Drug use: No    Review of Systems  Constitutional:  Negative for chills and fever.  HENT:  Positive for  trouble swallowing (choking). Negative for congestion, ear pain, rhinorrhea, sinus pressure and sore throat.   Respiratory:  Positive for shortness of breath. Negative for cough and wheezing.   Cardiovascular:  Negative for chest pain and palpitations.  Gastrointestinal:  Negative for diarrhea, nausea and vomiting.  Genitourinary:  Negative for dysuria.  Musculoskeletal:  Positive for back pain. Negative for myalgias.  Skin:  Negative for rash.  Neurological:  Negative for numbness and headaches.  Hematological:  Negative for adenopathy.     Objective:    BP 112/72 (BP Location: Left Arm, Patient Position: Sitting, Cuff Size: Large)   Pulse 71   Temp 98.5 F (36.9 C) (Oral)   Ht 6\' 4"  (1.93 m)   Wt (!) 331 lb 9.6 oz (150.4 kg)   SpO2 96%   BMI 40.36 kg/m  BP Readings from Last 3 Encounters:  12/07/20 112/72  11/07/20 108/64  02/17/20 120/76   Wt Readings from Last 3 Encounters:  12/07/20 (!) 331 lb 9.6 oz (150.4 kg)  11/07/20 (!) 332 lb (150.6 kg)  02/17/20 (!) 339 lb 9.6 oz (154 kg)    Physical Exam Vitals reviewed.  Constitutional:      Appearance: He is well-developed.  Neck:     Thyroid: No thyroid mass, thyromegaly or thyroid tenderness.  Cardiovascular:     Rate and Rhythm: Regular rhythm.     Heart sounds: Normal heart sounds.  Pulmonary:     Effort: Pulmonary effort is normal. No respiratory distress.     Breath sounds: Normal breath sounds. No wheezing or rales.  Musculoskeletal:     Lumbar back: Tenderness present. No swelling, spasms or bony tenderness. Normal range of motion. Negative right straight leg raise test and negative left straight leg raise test.     Comments: Full range of motion with flexion, extension, lateral side bends. No pain, numbness, tingling elicited with single leg raise bilaterally. No rash.  Tenderness appreciated with deep palpation of left SI joint.  Skin:    General: Skin is warm and dry.  Neurological:     Mental Status: He is  alert.  Psychiatric:        Speech: Speech normal.        Behavior: Behavior normal.       Assessment & Plan:   Problem List Items Addressed This Visit       Digestive   Liver fibrosis - Primary   Relevant Orders   Ambulatory referral to Gastroenterology     Other   Choking    Overall improved with the addition of omeprazole 40 mg taken once daily. Considering GERD as primary etiology.   Advised that he may start taking this in the morning prior to breakfast we will adjunct with Pepcid AC take it at  night.  Encouraged him to wait 2 hours after his last meal of the day prior to laying down to sleep.  He has seen ENT.  Advised ultrasound of thyroid showed sure this has no bearing on choking.  Patient politely declines at this time.  Referral to GI for further evaluation, discussion of EGD in the setting of suspected GERD and also for follow-up for liver fibrosis.  He has follow-up with ENT next month, and I emphasized the importance of discussing left maxillary retention cyst as seen on MRI as it relates to surveillance or treatment.  He verbalized understanding.  Close follow-up        Relevant Medications   famotidine (PEPCID) 20 MG tablet   Dizziness    Resolved.  Declines further evaluation at this time.      Low back pain    Consistent with SI joint dysfunction.  Advised to limit NSAID use in the setting of suspected acid reflux ( ? Gastritis).  Discussed Cymbalta and would like to start Cymbalta however in history of liver fibrosis, I would like to consult with GI first as he is over due.  Referral to GI has been placed.  Trial of gabapentin 100 mg 3 times daily for now.  We also may consider tramadol.  declines PT      Relevant Orders   Urinalysis, Routine w reflex microscopic   DG Lumbar Spine Complete     I have discontinued Shahab Breitenstein's metroNIDAZOLE. I am also having him start on famotidine. Additionally, I am having him maintain his cholecalciferol, cetirizine,  Cholecalciferol, cyclobenzaprine, albuterol, and omeprazole.   Meds ordered this encounter  Medications   famotidine (PEPCID) 20 MG tablet    Sig: Take 1 tablet (20 mg total) by mouth at bedtime.    Dispense:  90 tablet    Refill:  1    Order Specific Question:   Supervising Provider    Answer:   Crecencio Mc [[id number]]     Return precautions given.   Risks, benefits, and alternatives of the medications and treatment plan prescribed today were discussed, and patient expressed understanding.   Education regarding symptom management and diagnosis given to patient on AVS.  Continue to follow with Burnard Hawthorne, FNP for routine health maintenance.   Doylene Canard and I agreed with plan.   [name], FNP

## 2020-12-08 ENCOUNTER — Ambulatory Visit (INDEPENDENT_AMBULATORY_CARE_PROVIDER_SITE_OTHER): Payer: BC Managed Care – PPO

## 2020-12-08 ENCOUNTER — Other Ambulatory Visit: Payer: BC Managed Care – PPO

## 2020-12-08 DIAGNOSIS — M545 Low back pain, unspecified: Secondary | ICD-10-CM | POA: Diagnosis not present

## 2020-12-08 LAB — URINALYSIS, ROUTINE W REFLEX MICROSCOPIC
Bilirubin Urine: NEGATIVE
Hgb urine dipstick: NEGATIVE
Ketones, ur: NEGATIVE
Leukocytes,Ua: NEGATIVE
Nitrite: POSITIVE — AB
RBC / HPF: NONE SEEN (ref 0–?)
Specific Gravity, Urine: 1.02 (ref 1.000–1.030)
Total Protein, Urine: NEGATIVE
Urine Glucose: NEGATIVE
Urobilinogen, UA: 2 — AB (ref 0.0–1.0)
pH: 6.5 (ref 5.0–8.0)

## 2020-12-08 MED ORDER — GABAPENTIN 100 MG PO CAPS: 100.0000 mg | ORAL_CAPSULE | Freq: Three times a day (TID) | ORAL | 3 refills | Status: DC

## 2020-12-08 NOTE — Telephone Encounter (Signed)
I called and informed patient of message. He will try the Gabapentin & let us know if he does not hear from GI in regards to getting established.

## 2020-12-08 NOTE — Assessment & Plan Note (Signed)
Resolved.  Declines further evaluation at this time.

## 2020-12-08 NOTE — Telephone Encounter (Signed)
Call pt  I think Cymbalta would be most helpful however in the setting of history of liver fibrosis   HOWEVER until he is established with GI, as cymbalta is contraindicated in the setting of liver disease, I am more comfortable with using gabapentin.    We did discuss tramadol but I am concerned it might be a little bit more sedating.  If he is agreeable, I have sent in gabapentin to try first.  He may take this very low-dose 3 times a day.

## 2020-12-08 NOTE — Addendum Note (Signed)
Addended by: Burnard Hawthorne on: 12/08/2020 10:09 AM   Modules accepted: Orders

## 2020-12-13 ENCOUNTER — Other Ambulatory Visit: Payer: Self-pay | Admitting: *Deleted

## 2020-12-13 DIAGNOSIS — M545 Low back pain, unspecified: Secondary | ICD-10-CM

## 2020-12-15 ENCOUNTER — Other Ambulatory Visit: Payer: Self-pay

## 2020-12-15 ENCOUNTER — Other Ambulatory Visit (INDEPENDENT_AMBULATORY_CARE_PROVIDER_SITE_OTHER): Payer: BC Managed Care – PPO

## 2020-12-15 DIAGNOSIS — M545 Low back pain, unspecified: Secondary | ICD-10-CM | POA: Diagnosis not present

## 2020-12-16 LAB — URINALYSIS
Bilirubin Urine: NEGATIVE
Hgb urine dipstick: NEGATIVE
Ketones, ur: NEGATIVE
Leukocytes,Ua: NEGATIVE
Nitrite: NEGATIVE
Specific Gravity, Urine: 1.025 (ref 1.000–1.030)
Total Protein, Urine: NEGATIVE
Urine Glucose: NEGATIVE
Urobilinogen, UA: 1 (ref 0.0–1.0)
pH: 6 (ref 5.0–8.0)

## 2020-12-17 LAB — URINE CULTURE
MICRO NUMBER:: 12529370
SPECIMEN QUALITY:: ADEQUATE

## 2020-12-21 ENCOUNTER — Other Ambulatory Visit: Payer: Self-pay | Admitting: Family

## 2020-12-21 DIAGNOSIS — R8290 Unspecified abnormal findings in urine: Secondary | ICD-10-CM

## 2020-12-21 MED ORDER — AMOXICILLIN-POT CLAVULANATE 875-125 MG PO TABS: 1.0000 | ORAL_TABLET | Freq: Two times a day (BID) | ORAL | 0 refills | Status: AC

## 2021-01-04 DIAGNOSIS — J385 Laryngeal spasm: Secondary | ICD-10-CM | POA: Diagnosis not present

## 2021-01-04 DIAGNOSIS — K219 Gastro-esophageal reflux disease without esophagitis: Secondary | ICD-10-CM | POA: Diagnosis not present

## 2021-01-04 DIAGNOSIS — G4733 Obstructive sleep apnea (adult) (pediatric): Secondary | ICD-10-CM | POA: Diagnosis not present

## 2021-01-09 ENCOUNTER — Ambulatory Visit (INDEPENDENT_AMBULATORY_CARE_PROVIDER_SITE_OTHER): Payer: BC Managed Care – PPO | Admitting: Gastroenterology

## 2021-01-09 ENCOUNTER — Encounter: Payer: Self-pay | Admitting: Gastroenterology

## 2021-01-09 ENCOUNTER — Other Ambulatory Visit: Payer: Self-pay

## 2021-01-09 VITALS — BP 137/76 | HR 86 | Temp 98.8°F | Ht 76.0 in | Wt 334.6 lb

## 2021-01-09 DIAGNOSIS — F1010 Alcohol abuse, uncomplicated: Secondary | ICD-10-CM

## 2021-01-09 DIAGNOSIS — Z8619 Personal history of other infectious and parasitic diseases: Secondary | ICD-10-CM

## 2021-01-09 DIAGNOSIS — R932 Abnormal findings on diagnostic imaging of liver and biliary tract: Secondary | ICD-10-CM | POA: Diagnosis not present

## 2021-01-09 NOTE — Addendum Note (Signed)
Addended by: Wayna Chalet on: 01/09/2021 02:14 PM   Modules accepted: Orders

## 2021-01-09 NOTE — Progress Notes (Signed)
[name] MD, MRCP(U.K) [address]  [place]  [place], [place] [address]  Main: [phone]  Fax: [phone]   Gastroenterology Consultation  Referring Provider:     Burnard Hawthorne, FNP Primary Care Physician:  Burnard Hawthorne, FNP Primary Gastroenterologist:  Dr. [name]  Reason for Consultation:    Liver fibrosis        HPI:   Seth Simmons is a 50 y.o. y/o male referred for consultation & management  by Burnard Hawthorne, FNP.   He has been referred to see me for liver fibrosis.  Previously had been seen by gastroenterologist at [facility].  Back in March 2022.  Reviewing his last office note it states that he had chronic hepatitis C treated with Harvoni and ribavirin for 12 weeks and SVR attained.  Elastography in 2016 showed some F3 and F4 fibrosis.  EGD in 2016 showed normal esophagus.  Right upper quadrant ultrasound in 2017 showed mild cirrhotic changes but an ultrasound abdomen with elastography in 2019 showed F0 F1 fibrosis, hemangioma but no clear evidence of cirrhosis.  Completed Twinrix immunization for hepatitis A and B.  In back in 2020 he was drinking 2 beers on weekends.  The plan at the last office visit was to evaluate for nonalcoholic fatty liver disease and obtain ultrasound and MRI in 12 months.  12/03/2018 right upper quadrant ultrasound shows benign hemangioma left lobe of the liver fatty infiltration of the liver.  Labs 12/02/2019 hemoglobin 13.6 g with a platelet count of 166, albumin 4.2 normal LFTs. He has gained over 40 to 50 pounds of weight for the past few months.  He drinks about 8 beers only during the weekends.  No illegal drug use.  No other complaints.   Past Medical History:  Diagnosis Date   Anemia    Arthritis    Asthma    Genital warts    Hepatitis    Obesity     Past Surgical History:  Procedure Laterality Date   COLONOSCOPY WITH PROPOFOL N/A 01/28/2020   Procedure: COLONOSCOPY WITH PROPOFOL;  Surgeon:  [name], MD;  Location: [facility] ENDOSCOPY;  Service: Gastroenterology;  Laterality: N/A;   ESOPHAGOGASTRODUODENOSCOPY (EGD) WITH PROPOFOL N/A 10/21/2014   Procedure: ESOPHAGOGASTRODUODENOSCOPY (EGD) WITH PROPOFOL;  Surgeon: Josefine Class, MD;  Location: [facility] ENDOSCOPY;  Service: Endoscopy;  Laterality: N/A;   HIP SURGERY Left     Prior to Admission medications   Medication Sig Start Date End Date Taking? Authorizing Provider  albuterol (VENTOLIN HFA) 108 (90 Base) MCG/ACT inhaler INHALE 2 PUFFS INTO THE LUNGS EVERY 4 HOURS AS NEEDED FOR WHEEZING OR SHORTNESS OF BREATH. 07/05/20   Burnard Hawthorne, FNP  cetirizine (ZYRTEC) 10 MG tablet Take 1 tablet (10 mg total) by mouth daily. 06/13/18   Burnard Hawthorne, FNP  cholecalciferol (VITAMIN D3) 25 MCG (1000 UT) tablet Take 1,000 Units by mouth daily.    [provider]  Cholecalciferol 1.25 MG (50000 UT) capsule Take 1 capsule (50,000 Units total) by mouth once a week. 02/17/20   McLean-Scocuzza, Nino Glow, MD  cyclobenzaprine (FLEXERIL) 5 MG tablet Take 1-2 tablets (5-10 mg total) by mouth at bedtime as needed for muscle spasms. 02/17/20   McLean-Scocuzza, Nino Glow, MD  famotidine (PEPCID) 20 MG tablet Take 1 tablet (20 mg total) by mouth at bedtime. 12/07/20   Burnard Hawthorne, FNP  gabapentin (NEURONTIN) 100 MG capsule Take 1 capsule (100 mg total) by mouth 3 (three) times daily.  12/08/20   Burnard Hawthorne, FNP  omeprazole (PRILOSEC) 40 MG capsule Take 1 capsule by mouth daily. 11/09/20   [provider]    Family History  Problem Relation Age of Onset   Diabetes Mother    Diabetes Father    Diabetes Brother    Cancer Brother    Diabetes Brother    Colon cancer Neg Hx      Social History   Tobacco Use   Smoking status: Former    Types: Cigars   Smokeless tobacco: Never   Tobacco comments:    maybe 1 cigar every 3 months   Substance Use Topics   Alcohol use: Yes    Alcohol/week: 0.0 standard drinks     Comment: weekends;    Drug use: No    Allergies as of 01/09/2021   (No Known Allergies)    Review of Systems:    All systems reviewed and negative except where noted in HPI.   Physical Exam:  BP 137/76   Pulse 86   Temp 98.8 F (37.1 C) (Oral)   Ht 6\' 4"  (1.93 m)   Wt (!) 334 lb 9.6 oz (151.8 kg)   BMI 40.73 kg/m  No LMP for male patient. Psych:  Alert and cooperative. Normal mood and affect. General:   Alert,  Well-developed, well-nourished, pleasant and cooperative in NAD Head:  Normocephalic and atraumatic. Eyes:  Sclera clear, no icterus.   Conjunctiva pink. Ears:  Normal auditory acuity. Abdomen:  Normal bowel sounds.  No bruits.  Soft, non-tender and non-distended without masses, hepatosplenomegaly or hernias noted.  No guarding or rebound tenderness.    Msk:  Symmetrical without gross deformities. Good, equal movement & strength bilaterally. Lymph Nodes:  No significant cervical adenopathy. Psych:  Alert and cooperative. Normal mood and affect.  Imaging Studies: No results found.  Assessment and Plan:   Seth Simmons is a 50 y.o. y/o male with a history of chronic hepatitis C status posttreatment and attained SVR a few years back.  At part of the evaluation back in 2016 he had ultrasound which showed mild cirrhotic changes and concern for F3 fibrosis.  Subsequent ultrasounds have not shown any evidence of nodularity or portal hypertension.  Labs in 2021 showed a normal platelet count, normal albumin level normal bilirubin level and creatinine.  His labs back in 2021+ imaging make it less likely that he has advanced fibrosis.  There is no clear evidence of portal hypertension.  He does have fatty infiltration of the liver.  He has completed vaccination for hepatitis a and B.  He probably at this point of time has fatty liver disease due to combination of effects of nutritional calories and alcohol.  Plan 1.  Provide patient information on fatty liver disease. 2.  Check  hepatitis A and hepatitis B antibody levels to determine if he has developed immunity to the vaccines. 3.  Check CMP, CBC, INR to ensure that there is good liver function which would be indicated by a normal albumin and INR which are markers of liver function.  Presently has good liver function from the data that we have 4.  I will also obtain a right upper quadrant ultrasound since it has been over 2 years since the last and if there is any concern subsequently can obtain elastography or MRI.  It is no evidence of fibrosis he would not require any further evaluation. 5.  Advised that he is probably binge drinking at this point of time advised  to cut down.  Advised to follow Mediterranean diet to lose weight due to decreased calorie intake.  Provided patient information.  If he does not show progress by next visit we will refer him to a dietitian which he is not interested at this point of time.  Follow up in 3 to 4 months  Dr [name] MD,MRCP(U.K)

## 2021-01-09 NOTE — Patient Instructions (Signed)
Nonalcoholic Fatty Liver Disease Diet, Adult Nonalcoholic fatty liver disease is a condition that causes fat to build up in and around the liver. The disease makes it harder for the liver to work the way that it should. Following a healthy diet can help to keep nonalcoholic fatty liver disease under control. It can also help to prevent or improve conditions that are associated with the disease, such as heart disease, diabetes, high blood pressure, and abnormal cholesterol levels. Along with regular exercise, this diet: Promotes weight loss. Helps to control blood sugar levels. Helps to improve the way that the body uses insulin. What are tips for following this plan? Reading food labels Always check food labels for: The amount of saturated fat in a food. You should limit your intake of saturated fat. Saturated fat is found in foods that come from animals, including meat and dairy products such as butter, cheese, and whole milk. The amount of fiber in a food. You should choose high-fiber foods such as fruits, vegetables, and whole grains. Try to get 25-30 grams (g) of fiber a day.  Cooking When cooking, use heart-healthy oils that are high in monounsaturated fats. These include olive oil, canola oil, and avocado oil. Limit frying or deep-frying foods. Cook foods using healthy methods such as baking, boiling, steaming, and grilling instead. Meal planning You may want to keep track of how many calories you take in. Eating the right amount of calories will help you achieve a healthy weight. Meeting with a registered dietitian can help you get started. Limit how often you eat takeout and fast food. These foods are usually very high in fat, salt, and sugar. Use the glycemic index (GI) to plan your meals. The index tells you how quickly a food will raise your blood sugar. Choose low-GI foods (GI less than 55). These foods take a longer time to raise blood sugar. A registered dietitian can help you  identify foods lower on the GI scale. Lifestyle You may want to follow a Mediterranean diet. This diet includes a lot of vegetables, lean meats or fish, whole grains, fruits, and healthy oils and fats. What foods can I eat? Fruits Bananas. Apples. Oranges. Grapes. Papaya. Mango. Pomegranate. Kiwi. Grapefruit. Cherries. Vegetables Lettuce. Spinach. Peas. Beets. Cauliflower. Cabbage. Broccoli. Carrots. Tomatoes. Squash. Eggplant. Herbs. Peppers. Onions. Cucumbers. Brussels sprouts. Yams and sweet potatoes. Beans. Lentils. Grains Whole wheat or whole-grain foods, including breads, crackers, cereals, and pasta. Stone-ground whole wheat. Unsweetened oatmeal. Bulgur. Barley. Quinoa. Brown or wild rice. Corn or whole wheat flour tortillas. Meats and other proteins Lean meats. Poultry. Tofu. Seafood and shellfish. Dairy Low-fat or fat-free dairy products, such as yogurt, cottage cheese, or cheese. Beverages Water. Sugar-free drinks. Tea. Coffee. Low-fat or skim milk. Milk alternatives, such as soy or almond milk. Real fruit juice. Fats and oils Avocado. Canola or olive oil. Nuts and nut butters. Seeds. Seasonings and condiments Mustard. Relish. Low-fat, low-sugar ketchup and barbecue sauce. Low-fat or fat-free mayonnaise. Sweets and desserts Sugar-free sweets. The items listed above may not be a complete list of foods and beverages you can eat. Contact a dietitian for more information. What foods should I limit or avoid? Meats and other proteins Limit red meat to 1-2 times a week. Dairy [employer]. Fats and oils Palm oil and coconut oil. Fried foods. Other foods Processed foods. Foods that contain a lot of salt or sodium. Sweets and desserts Sweets that contain sugar. Beverages Sweetened drinks, such as sweet tea, milkshakes, iced sweet drinks,  and sodas. Alcohol. The items listed above may not be a complete list of foods and beverages you should avoid. Contact a dietitian for more  information. Where to find more information The [employer] of Diabetes and Digestive and Kidney Diseases: [email or web address] Summary Nonalcoholic fatty liver disease is a condition that causes fat to build up in and around the liver. Following a healthy diet can help to keep nonalcoholic fatty liver disease under control. Your diet should be rich in fruits, vegetables, whole grains, and lean proteins. Limit your intake of saturated fat. Saturated fat is found in foods that come from animals, including meat and dairy products such as butter, cheese, and whole milk. This diet promotes weight loss, helps to control blood sugar levels, and helps to improve the way that the body uses insulin. This information is not intended to replace advice given to you by your health care provider. Make sure you discuss any questions you have with your health care provider. Document Revised: 06/06/2018 Document Reviewed: 03/06/2018 Elsevier Patient Education  2022 [place] refers to food and lifestyle choices that are based on the traditions of countries located on the [employer]. It focuses on eating more fruits, vegetables, whole grains, beans, nuts, seeds, and heart-healthy fats, and eating less dairy, meat, eggs, and processed foods with added sugar, salt, and fat. This way of eating has been shown to help prevent certain conditions and improve outcomes for people who have chronic diseases, like kidney disease and heart disease. What are tips for following this plan? Reading food labels Check the serving size of packaged foods. For foods such as rice and pasta, the serving size refers to the amount of cooked product, not dry. Check the total fat in packaged foods. Avoid foods that have saturated fat or trans fats. Check the ingredient list for added sugars, such as corn syrup. Shopping  Buy a variety of foods that offer a balanced diet, including: Fresh  fruits and vegetables (produce). Grains, beans, nuts, and seeds. Some of these may be available in unpackaged forms or large amounts (in bulk). Fresh seafood. Poultry and eggs. Low-fat dairy products. Buy whole ingredients instead of prepackaged foods. Buy fresh fruits and vegetables in-season from local farmers markets. Buy plain frozen fruits and vegetables. If you do not have access to quality fresh seafood, buy precooked frozen shrimp or canned fish, such as tuna, salmon, or sardines. Stock your pantry so you always have certain foods on hand, such as olive oil, canned tuna, canned tomatoes, rice, pasta, and beans. Cooking Cook foods with extra-virgin olive oil instead of using butter or other vegetable oils. Have meat as a side dish, and have vegetables or grains as your main dish. This means having meat in small portions or adding small amounts of meat to foods like pasta or stew. Use beans or vegetables instead of meat in common dishes like chili or lasagna. Experiment with different cooking methods. Try roasting, broiling, steaming, and sauting vegetables. Add frozen vegetables to soups, stews, pasta, or rice. Add nuts or seeds for added healthy fats and plant protein at each meal. You can add these to yogurt, salads, or vegetable dishes. Marinate fish or vegetables using olive oil, lemon juice, garlic, and fresh herbs. Meal planning Plan to eat one vegetarian meal one day each week. Try to work up to two vegetarian meals, if possible. Eat seafood two or more times a week. Have healthy snacks readily available, such as: Vegetable sticks  with hummus. Greek yogurt. Fruit and nut trail mix. Eat balanced meals throughout the week. This includes: Fruit: 2-3 servings a day. Vegetables: 4-5 servings a day. Low-fat dairy: 2 servings a day. Fish, poultry, or lean meat: 1 serving a day. Beans and legumes: 2 or more servings a week. Nuts and seeds: 1-2 servings a day. Whole grains: 6-8  servings a day. Extra-virgin olive oil: 3-4 servings a day. Limit red meat and sweets to only a few servings a month. Lifestyle  Cook and eat meals together with your family, when possible. Drink enough fluid to keep your urine pale yellow. Be physically active every day. This includes: Aerobic exercise like running or swimming. Leisure activities like gardening, walking, or housework. Get 7-8 hours of sleep each night. If recommended by your health care provider, drink red wine in moderation. This means 1 glass a day for nonpregnant women and 2 glasses a day for men. A glass of wine equals 5 oz (150 mL). What foods should I eat? Fruits Apples. Apricots. Avocado. Berries. Bananas. Cherries. Dates. Figs. Grapes. Lemons. Melon. Oranges. Peaches. Plums. Pomegranate. Vegetables Artichokes. Beets. Broccoli. Cabbage. Carrots. Eggplant. Green beans. Chard. Kale. Spinach. Onions. Leeks. Peas. Squash. Tomatoes. Peppers. Radishes. Grains Whole-grain pasta. Brown rice. Bulgur wheat. Polenta. Couscous. Whole-wheat bread. [place]. Meats and other proteins Beans. Almonds. Sunflower seeds. Pine nuts. Peanuts. [place]. Salmon. Scallops. Shrimp. [place]. Tilapia. Clams. Oysters. Eggs. Poultry without skin. Dairy Low-fat milk. Cheese. Greek yogurt. Fats and oils Extra-virgin olive oil. Avocado oil. Grapeseed oil. Beverages Water. Red wine. Herbal tea. Sweets and desserts Greek yogurt with honey. Baked apples. Poached pears. Trail mix. Seasonings and condiments Basil. Cilantro. Coriander. Cumin. Mint. Parsley. Sage. Rosemary. Tarragon. Garlic. Oregano. Thyme. Pepper. Balsamic vinegar. Tahini. Hummus. Tomato sauce. Olives. Mushrooms. The items listed above may not be a complete list of foods and beverages you can eat. Contact a dietitian for more information. What foods should I limit? This is a list of foods that should be eaten rarely or only on special occasions. Fruits Fruit canned in  syrup. Vegetables Deep-fried potatoes (french fries). Grains Prepackaged pasta or rice dishes. Prepackaged cereal with added sugar. Prepackaged snacks with added sugar. Meats and other proteins Beef. Pork. Lamb. Poultry with skin. Hot dogs. Berniece Salines. Dairy Ice cream. Sour cream. Whole milk. Fats and oils Butter. Canola oil. Vegetable oil. Beef fat (tallow). Lard. Beverages Juice. Sugar-sweetened soft drinks. Beer. Liquor and spirits. Sweets and desserts Cookies. Cakes. Pies. Candy. Seasonings and condiments Mayonnaise. Pre-made sauces and marinades. The items listed above may not be a complete list of foods and beverages you should limit. Contact a dietitian for more information. Summary The Mediterranean diet includes both food and lifestyle choices. Eat a variety of fresh fruits and vegetables, beans, nuts, seeds, and whole grains. Limit the amount of red meat and sweets that you eat. If recommended by your health care provider, drink red wine in moderation. This means 1 glass a day for nonpregnant women and 2 glasses a day for men. A glass of wine equals 5 oz (150 mL). This information is not intended to replace advice given to you by your health care provider. Make sure you discuss any questions you have with your health care provider. Document Revised: 03/20/2019 Document Reviewed: 01/15/2019 Elsevier Patient Education  2022 [employer].

## 2021-01-10 LAB — CBC WITH DIFFERENTIAL/PLATELET
Basophils Absolute: 0 x10E3/uL (ref 0.0–0.2)
Basos: 1 %
EOS (ABSOLUTE): 0.1 x10E3/uL (ref 0.0–0.4)
Eos: 2 %
Hematocrit: 39.6 % (ref 37.5–51.0)
Hemoglobin: 13.2 g/dL (ref 13.0–17.7)
Immature Grans (Abs): 0 x10E3/uL (ref 0.0–0.1)
Immature Granulocytes: 0 %
Lymphocytes Absolute: 1.3 x10E3/uL (ref 0.7–3.1)
Lymphs: 31 %
MCH: 28 pg (ref 26.6–33.0)
MCHC: 33.3 g/dL (ref 31.5–35.7)
MCV: 84 fL (ref 79–97)
Monocytes Absolute: 0.3 x10E3/uL (ref 0.1–0.9)
Monocytes: 6 %
Neutrophils Absolute: 2.5 x10E3/uL (ref 1.4–7.0)
Neutrophils: 60 %
Platelets: 169 x10E3/uL (ref 150–450)
RBC: 4.72 x10E6/uL (ref 4.14–5.80)
RDW: 13.4 % (ref 11.6–15.4)
WBC: 4.2 x10E3/uL (ref 3.4–10.8)

## 2021-01-10 LAB — PROTIME-INR
INR: 0.9 (ref 0.9–1.2)
Prothrombin Time: 10 s (ref 9.1–12.0)

## 2021-01-10 LAB — COMPREHENSIVE METABOLIC PANEL WITH GFR
ALT: 18 IU/L (ref 0–44)
AST: 25 IU/L (ref 0–40)
Albumin/Globulin Ratio: 1.6 (ref 1.2–2.2)
Albumin: 4.5 g/dL (ref 4.0–5.0)
Alkaline Phosphatase: 86 IU/L (ref 44–121)
BUN/Creatinine Ratio: 14 (ref 9–20)
BUN: 12 mg/dL (ref 6–24)
Bilirubin Total: 0.2 mg/dL (ref 0.0–1.2)
CO2: 22 mmol/L (ref 20–29)
Calcium: 8.9 mg/dL (ref 8.7–10.2)
Chloride: 106 mmol/L (ref 96–106)
Creatinine, Ser: 0.85 mg/dL (ref 0.76–1.27)
Globulin, Total: 2.8 g/dL (ref 1.5–4.5)
Glucose: 95 mg/dL (ref 70–99)
Potassium: 3.9 mmol/L (ref 3.5–5.2)
Sodium: 144 mmol/L (ref 134–144)
Total Protein: 7.3 g/dL (ref 6.0–8.5)
eGFR: 107 mL/min/1.73

## 2021-01-10 LAB — HEPATITIS B SURFACE ANTIBODY,QUALITATIVE: Hep B Surface Ab, Qual: REACTIVE

## 2021-01-10 LAB — HEPATITIS A ANTIBODY, TOTAL: hep A Total Ab: POSITIVE — AB

## 2021-01-11 ENCOUNTER — Telehealth: Payer: Self-pay | Admitting: Gastroenterology

## 2021-01-11 LAB — HCV RNA BY PCR, QN RFX GENO: HCV Quant Baseline: NOT DETECTED IU/mL

## 2021-01-11 NOTE — Telephone Encounter (Signed)
Inbound call from pt requesting a call back regarding some results.

## 2021-01-12 ENCOUNTER — Other Ambulatory Visit: Payer: Self-pay

## 2021-01-12 ENCOUNTER — Ambulatory Visit
Admission: RE | Admit: 2021-01-12 | Discharge: 2021-01-12 | Disposition: A | Discharge status: Home / Self Care | Payer: BC Managed Care – PPO | Source: Ambulatory Visit | Attending: Gastroenterology | Admitting: Gastroenterology

## 2021-01-12 DIAGNOSIS — K760 Fatty (change of) liver, not elsewhere classified: Secondary | ICD-10-CM | POA: Diagnosis not present

## 2021-01-12 DIAGNOSIS — R932 Abnormal findings on diagnostic imaging of liver and biliary tract: Secondary | ICD-10-CM | POA: Insufficient documentation

## 2021-01-12 DIAGNOSIS — Z8619 Personal history of other infectious and parasitic diseases: Secondary | ICD-10-CM | POA: Insufficient documentation

## 2021-01-12 NOTE — Telephone Encounter (Signed)
Called patient and let him know what his results were and he had no further questions.

## 2021-01-12 NOTE — Telephone Encounter (Signed)
Labs revealed that he is immune to hepatitis A and B.  He does not have hep C.  Rest of the labs are normal  RV

## 2021-01-12 NOTE — Telephone Encounter (Signed)
Can you please review his labs and let me know what I could tell him. Thank you.

## 2021-02-06 ENCOUNTER — Other Ambulatory Visit: Payer: Self-pay

## 2021-02-06 ENCOUNTER — Ambulatory Visit (INDEPENDENT_AMBULATORY_CARE_PROVIDER_SITE_OTHER): Payer: BC Managed Care – PPO | Admitting: Family

## 2021-02-06 ENCOUNTER — Encounter: Payer: Self-pay | Admitting: Family

## 2021-02-06 VITALS — BP 118/72 | HR 88 | Temp 98.4°F | Ht 76.0 in | Wt 341.0 lb

## 2021-02-06 DIAGNOSIS — Z0000 Encounter for general adult medical examination without abnormal findings: Secondary | ICD-10-CM

## 2021-02-06 DIAGNOSIS — R3911 Hesitancy of micturition: Secondary | ICD-10-CM | POA: Diagnosis not present

## 2021-02-06 DIAGNOSIS — Z0001 Encounter for general adult medical examination with abnormal findings: Secondary | ICD-10-CM

## 2021-02-06 DIAGNOSIS — B182 Chronic viral hepatitis C: Secondary | ICD-10-CM

## 2021-02-06 DIAGNOSIS — M545 Low back pain, unspecified: Secondary | ICD-10-CM | POA: Diagnosis not present

## 2021-02-06 MED ORDER — DULOXETINE HCL 30 MG PO CPEP: 30.0000 mg | ORAL_CAPSULE | Freq: Every day | ORAL | 3 refills | Status: DC

## 2021-02-06 NOTE — Assessment & Plan Note (Signed)
No evidence of cirrhosis on recent ultrasound 12/2020.

## 2021-02-06 NOTE — Progress Notes (Signed)
Subjective:    Patient ID: Seth Simmons, male    DOB: May 22, 1970, 50 y.o.   MRN: [id number]  CC: Seth Simmons is a 50 y.o. male who presents today for physical exam.    HPI: Low back pain- Comes and goes. He wll go a week or two weeks without pain. He tales aleve 2 tablets with resolve.  No abdominal pain, numbness, saddle anesthesia, trouble having a bowel movement    Consult with Dr. Carlean Jews 01/09/2021 in regards to history of chronic hepatitis C.  Low clinical suspicion for advanced fibrosis.  Ultrasound performed 01/12/2021 showed hepatic steatosis with stable benign hemangioma.  He declines pursing MRI lumbar  Colorectal  Cancer Screening: UTD , repeat in 7 years Prostate Cancer Screening: he endorses urinary hesitancy, decreased stream  Lung Cancer Screening: No 30 year pack year history and > 50 years to 80 years.   Immunizations       Tetanus - UTD         Labs: Screening labs today. Exercise: Gets regular exercise at work by walking.  Alcohol use:  occasional Smoking/tobacco use: Nonsmoker.     HISTORY:  Past Medical History:  Diagnosis Date   Anemia    Arthritis    Asthma    Genital warts    Hepatitis    Obesity     Past Surgical History:  Procedure Laterality Date   COLONOSCOPY WITH PROPOFOL N/A 01/28/2020   Procedure: COLONOSCOPY WITH PROPOFOL;  Surgeon: [name], MD;  Location: [facility] ENDOSCOPY;  Service: Gastroenterology;  Laterality: N/A;   ESOPHAGOGASTRODUODENOSCOPY (EGD) WITH PROPOFOL N/A 10/21/2014   Procedure: ESOPHAGOGASTRODUODENOSCOPY (EGD) WITH PROPOFOL;  Surgeon: Josefine Class, MD;  Location: [facility] ENDOSCOPY;  Service: Endoscopy;  Laterality: N/A;   HIP SURGERY Left    Family History  Problem Relation Age of Onset   Diabetes Mother    Diabetes Father    Diabetes Brother    Cancer Brother    Diabetes Brother    Colon cancer Neg Hx       ALLERGIES: Patient has no known allergies.  Current Outpatient Medications on File Prior to  Visit  Medication Sig Dispense Refill   cyclobenzaprine (FLEXERIL) 5 MG tablet Take 1-2 tablets (5-10 mg total) by mouth at bedtime as needed for muscle spasms. 60 tablet 2   naproxen sodium (ALEVE) 220 MG tablet Take 220 mg by mouth.     No current facility-administered medications on file prior to visit.    Social History   Tobacco Use   Smoking status: Former    Types: Cigars   Smokeless tobacco: Never   Tobacco comments:    maybe 1 cigar every 3 months   Substance Use Topics   Alcohol use: Yes    Alcohol/week: 0.0 standard drinks    Comment: weekends;    Drug use: No    Review of Systems  Constitutional:  Negative for chills and fever.  HENT:  Negative for congestion.   Respiratory:  Negative for cough.   Cardiovascular:  Negative for chest pain, palpitations and leg swelling.  Gastrointestinal:  Negative for abdominal pain, diarrhea, nausea and vomiting.  Genitourinary:  Positive for difficulty urinating.  Musculoskeletal:  Positive for back pain. Negative for myalgias.  Skin:  Negative for rash.  Neurological:  Negative for numbness and headaches.  Hematological:  Negative for adenopathy.  Psychiatric/Behavioral:  Negative for confusion.      Objective:    BP 118/72 (BP Location: Left Arm, Patient Position: Sitting, Cuff  Size: Large)   Pulse 88   Temp 98.4 F (36.9 C) (Oral)   Ht 6\' 4"  (1.93 m)   Wt (!) 341 lb (154.7 kg)   SpO2 97%   BMI 41.51 kg/m   BP Readings from Last 3 Encounters:  02/06/21 118/72  01/09/21 137/76  12/07/20 112/72   Wt Readings from Last 3 Encounters:  02/06/21 (!) 341 lb (154.7 kg)  01/09/21 (!) 334 lb 9.6 oz (151.8 kg)  12/07/20 (!) 331 lb 9.6 oz (150.4 kg)    Physical Exam Vitals reviewed.  Constitutional:      Appearance: He is well-developed.  Neck:     Thyroid: No thyroid mass or thyromegaly.  Cardiovascular:     Rate and Rhythm: Regular rhythm.     Heart sounds: Normal heart sounds.  Pulmonary:     Effort:  Pulmonary effort is normal. No respiratory distress.     Breath sounds: Normal breath sounds. No wheezing, rhonchi or rales.  Musculoskeletal:     Lumbar back: No swelling, spasms or tenderness. Normal range of motion.     Comments: Full range of motion with flexion, extension, lateral side bends. No pain, numbness, tingling elicited with single leg raise bilaterally. No rash.  Lymphadenopathy:     Head:     Right side of head: No submental, submandibular, tonsillar, preauricular, posterior auricular or occipital adenopathy.     Left side of head: No submental, submandibular, tonsillar, preauricular, posterior auricular or occipital adenopathy.     Cervical: No cervical adenopathy.  Skin:    General: Skin is warm and dry.  Neurological:     Mental Status: He is alert.  Psychiatric:        Speech: Speech normal.        Behavior: Behavior normal.       Assessment & Plan:   Problem List Items Addressed This Visit       Digestive   Hepatitis C virus infection without hepatic coma (Chronic)    No evidence of cirrhosis on recent ultrasound 12/2020.          Other   Low back pain    Chronic, improved. He declines MRI lumbar.  In the absence of cirrhosis, elevated liver enzymes advised  to start Cymbalta Monitor liver enzymes.  Start Cymbalta 30 mg.      Relevant Medications   naproxen sodium (ALEVE) 220 MG tablet   DULoxetine (CYMBALTA) 30 MG capsule   Routine physical examination - Primary    Encouraged continued exercise.  Colonoscopy is up-to-date.  Screening labs ordered/       Relevant Medications   DULoxetine (CYMBALTA) 30 MG capsule   Other Relevant Orders   PSA   Hemoglobin A1c   TSH   Lipid panel   CBC with Differential/Platelet   Comprehensive metabolic panel   Urinary hesitancy    New.  Pending urinalysis, referral to urology.  Will follow      Relevant Orders   PSA   Ambulatory referral to Urology   Urinalysis, Routine w reflex microscopic     I  have discontinued Ollen Gross Direnzo's omeprazole, famotidine, and gabapentin. I am also having him start on DULoxetine. Additionally, I am having him maintain his cyclobenzaprine and naproxen sodium.   Meds ordered this encounter  Medications   DULoxetine (CYMBALTA) 30 MG capsule    Sig: Take 1 capsule (30 mg total) by mouth daily.    Dispense:  90 capsule    Refill:  3  Order Specific Question:   Supervising Provider    Answer:   Crecencio Mc [[id number]]     Return precautions given.   Risks, benefits, and alternatives of the medications and treatment plan prescribed today were discussed, and patient expressed understanding.   Education regarding symptom management and diagnosis given to patient on AVS.   Continue to follow with Burnard Hawthorne, FNP for routine health maintenance.   Doylene Canard and I agreed with plan.   [name], FNP

## 2021-02-06 NOTE — Assessment & Plan Note (Signed)
Encouraged continued exercise.  Colonoscopy is up-to-date.  Screening labs ordered/

## 2021-02-06 NOTE — Assessment & Plan Note (Signed)
Chronic, improved. He declines MRI lumbar.  In the absence of cirrhosis, elevated liver enzymes advised  to start Cymbalta Monitor liver enzymes.  Start Cymbalta 30 mg.

## 2021-02-06 NOTE — Assessment & Plan Note (Signed)
New.  Pending urinalysis, referral to urology.  Will follow

## 2021-02-06 NOTE — Patient Instructions (Signed)
Start Cymbalta.  We can gradually increase this medication Referral to urology Let us know if you dont hear back within a week in regards to an appointment being scheduled.   Happy holidays!  Health Maintenance, Male Adopting a healthy lifestyle and getting preventive care are important in promoting health and wellness. Ask your health care provider about: The right schedule for you to have regular tests and exams. Things you can do on your own to prevent diseases and keep yourself healthy. What should I know about diet, weight, and exercise? Eat a healthy diet  Eat a diet that includes plenty of vegetables, fruits, low-fat dairy products, and lean protein. Do not eat a lot of foods that are high in solid fats, added sugars, or sodium. Maintain a healthy weight Body mass index (BMI) is a measurement that can be used to identify possible weight problems. It estimates body fat based on height and weight. Your health care provider can help determine your BMI and help you achieve or maintain a healthy weight. Get regular exercise Get regular exercise. This is one of the most important things you can do for your health. Most adults should: Exercise for at least 150 minutes each week. The exercise should increase your heart rate and make you sweat (moderate-intensity exercise). Do strengthening exercises at least twice a week. This is in addition to the moderate-intensity exercise. Spend less time sitting. Even light physical activity can be beneficial. Watch cholesterol and blood lipids Have your blood tested for lipids and cholesterol at 49 years of age, then have this test every 5 years. You may need to have your cholesterol levels checked more often if: Your lipid or cholesterol levels are high. You are older than 49 years of age. You are at high risk for heart disease. What should I know about cancer screening? Many types of cancers can be detected early and may often be prevented.  Depending on your health history and family history, you may need to have cancer screening at various ages. This may include screening for: Colorectal cancer. Prostate cancer. Skin cancer. Lung cancer. What should I know about heart disease, diabetes, and high blood pressure? Blood pressure and heart disease High blood pressure causes heart disease and increases the risk of stroke. This is more likely to develop in people who have high blood pressure readings or are overweight. Talk with your health care provider about your target blood pressure readings. Have your blood pressure checked: Every 3-5 years if you are 16-56 years of age. Every year if you are 18 years old or older. If you are between the ages of 36 and 66 and are a current or former smoker, ask your health care provider if you should have a one-time screening for abdominal aortic aneurysm (AAA). Diabetes Have regular diabetes screenings. This checks your fasting blood sugar level. Have the screening done: Once every three years after age 65 if you are at a normal weight and have a low risk for diabetes. More often and at a younger age if you are overweight or have a high risk for diabetes. What should I know about preventing infection? Hepatitis B If you have a higher risk for hepatitis B, you should be screened for this virus. Talk with your health care provider to find out if you are at risk for hepatitis B infection. Hepatitis C Blood testing is recommended for: Everyone born from [birth year] through 1964. Anyone with known risk factors for hepatitis C. Sexually transmitted infections (STIs)  You should be screened each year for STIs, including gonorrhea and chlamydia, if: You are sexually active and are younger than 49 years of age. You are older than 49 years of age and your health care provider tells you that you are at risk for this type of infection. Your sexual activity has changed since you were last screened, and you are  at increased risk for chlamydia or gonorrhea. Ask your health care provider if you are at risk. Ask your health care provider about whether you are at high risk for HIV. Your health care provider may recommend a prescription medicine to help prevent HIV infection. If you choose to take medicine to prevent HIV, you should first get tested for HIV. You should then be tested every 3 months for as long as you are taking the medicine. Follow these instructions at home: Alcohol use Do not drink alcohol if your health care provider tells you not to drink. If you drink alcohol: Limit how much you have to 0-2 drinks a day. Know how much alcohol is in your drink. In the [place] one drink equals one 12 oz bottle of beer (355 mL), one 5 oz glass of wine (148 mL), or one 1 oz glass of hard liquor (44 mL). Lifestyle Do not use any products that contain nicotine or tobacco. These products include cigarettes, chewing tobacco, and vaping devices, such as e-cigarettes. If you need help quitting, ask your health care provider. Do not use street drugs. Do not share needles. Ask your health care provider for help if you need support or information about quitting drugs. General instructions Schedule regular health, dental, and eye exams. Stay current with your vaccines. Tell your health care provider if: You often feel depressed. You have ever been abused or do not feel safe at home. Summary Adopting a healthy lifestyle and getting preventive care are important in promoting health and wellness. Follow your health care provider's instructions about healthy diet, exercising, and getting tested or screened for diseases. Follow your health care provider's instructions on monitoring your cholesterol and blood pressure. This information is not intended to replace advice given to you by your health care provider. Make sure you discuss any questions you have with your health care provider. Document Revised: 07/04/2020  Document Reviewed: 07/04/2020 Elsevier Patient Education  [place].

## 2021-02-07 LAB — TSH: TSH: 0.6 u[IU]/mL (ref 0.35–5.50)

## 2021-02-07 LAB — CBC WITH DIFFERENTIAL/PLATELET
Basophils Absolute: 0.1 10*3/uL (ref 0.0–0.1)
Basophils Relative: 1.2 % (ref 0.0–3.0)
Eosinophils Absolute: 0.1 10*3/uL (ref 0.0–0.7)
Eosinophils Relative: 1.2 % (ref 0.0–5.0)
HCT: 42.3 % (ref 39.0–52.0)
Hemoglobin: 14 g/dL (ref 13.0–17.0)
Lymphocytes Relative: 29.1 % (ref 12.0–46.0)
Lymphs Abs: 1.4 10*3/uL (ref 0.7–4.0)
MCHC: 33 g/dL (ref 30.0–36.0)
MCV: 85.7 fl (ref 78.0–100.0)
Monocytes Absolute: 0.5 10*3/uL (ref 0.1–1.0)
Monocytes Relative: 9.5 % (ref 3.0–12.0)
Neutro Abs: 2.9 10*3/uL (ref 1.4–7.7)
Neutrophils Relative %: 59 % (ref 43.0–77.0)
Platelets: 172 10*3/uL (ref 150.0–400.0)
RBC: 4.94 Mil/uL (ref 4.22–5.81)
RDW: 13.9 % (ref 11.5–15.5)
WBC: 4.8 10*3/uL (ref 4.0–10.5)

## 2021-02-07 LAB — URINALYSIS, ROUTINE W REFLEX MICROSCOPIC
Bilirubin Urine: NEGATIVE
Hgb urine dipstick: NEGATIVE
Ketones, ur: NEGATIVE
Leukocytes,Ua: NEGATIVE
Nitrite: NEGATIVE
RBC / HPF: NONE SEEN (ref 0–?)
Specific Gravity, Urine: >=1.03 — AB (ref 1.000–1.030)
Total Protein, Urine: NEGATIVE
Urine Glucose: NEGATIVE
Urobilinogen, UA: 0.2 (ref 0.0–1.0)
WBC, UA: NONE SEEN (ref 0–?)
pH: 6 (ref 5.0–8.0)

## 2021-02-07 LAB — PSA: PSA: 0.55 ng/mL (ref 0.10–4.00)

## 2021-02-07 LAB — COMPREHENSIVE METABOLIC PANEL
ALT: 15 U/L (ref 0–53)
AST: 15 U/L (ref 0–37)
Albumin: 4.2 g/dL (ref 3.5–5.2)
Alkaline Phosphatase: 74 U/L (ref 39–117)
BUN: 20 mg/dL (ref 6–23)
CO2: 27 mEq/L (ref 19–32)
Calcium: 9.4 mg/dL (ref 8.4–10.5)
Chloride: 103 mEq/L (ref 96–112)
Creatinine, Ser: 0.77 mg/dL (ref 0.40–1.50)
GFR: 105.26 mL/min (ref >60.00)
Glucose, Bld: 105 mg/dL — ABNORMAL HIGH (ref 70–99)
Potassium: 4.1 mEq/L (ref 3.5–5.1)
Sodium: 139 mEq/L (ref 135–145)
Total Bilirubin: 0.4 mg/dL (ref 0.2–1.2)
Total Protein: 7.2 g/dL (ref 6.0–8.3)

## 2021-02-07 LAB — LIPID PANEL
Cholesterol: 207 mg/dL — ABNORMAL HIGH (ref 0–200)
HDL: 72.4 mg/dL (ref >39.00)
LDL Cholesterol: 124 mg/dL — ABNORMAL HIGH (ref 0–99)
NonHDL: 134.64
Total CHOL/HDL Ratio: 3
Triglycerides: 51 mg/dL (ref 0.0–149.0)
VLDL: 10.2 mg/dL (ref 0.0–40.0)

## 2021-02-07 LAB — HEMOGLOBIN A1C: Hgb A1c MFr Bld: 5.7 % (ref 4.6–6.5)

## 2021-02-15 DIAGNOSIS — G4733 Obstructive sleep apnea (adult) (pediatric): Secondary | ICD-10-CM | POA: Diagnosis not present

## 2021-03-01 ENCOUNTER — Ambulatory Visit: Payer: Self-pay | Admitting: Urology

## 2021-03-18 DIAGNOSIS — G4733 Obstructive sleep apnea (adult) (pediatric): Secondary | ICD-10-CM | POA: Diagnosis not present

## 2021-03-29 ENCOUNTER — Encounter: Payer: Self-pay | Admitting: Urology

## 2021-03-29 ENCOUNTER — Ambulatory Visit: Payer: BC Managed Care – PPO | Admitting: Urology

## 2021-03-29 ENCOUNTER — Other Ambulatory Visit: Payer: Self-pay

## 2021-03-29 VITALS — BP 120/76 | HR 73 | Ht 76.0 in | Wt 329.0 lb

## 2021-03-29 DIAGNOSIS — R3911 Hesitancy of micturition: Secondary | ICD-10-CM

## 2021-03-29 LAB — BLADDER SCAN AMB NON-IMAGING: Scan Result: 0

## 2021-03-29 NOTE — Progress Notes (Signed)
03/29/2021 6:10 PM   Seth Simmons [birth year] [id number]  Referring provider: Burnard Hawthorne, FNP [address] [place],  [place] [address]  Chief Complaint  Patient presents with   urinary hesitancy    HPI: Seth Simmons is a 51 y.o. male referred for evaluation of urinary hesitancy.  Visit with [name], FNP 02/06/2021 with complaints of urinary hesitancy and decreased force and caliber of urinary stream and referral placed at that time. Today Seth Simmons states he has no bothersome lower urinary tract symptoms.  He states he was having voiding symptoms 2 months ago and was treated with antibiotics with resolution.  On record review he did have a urine culture which was nitrite + and was treated however his only symptoms at that time are low back pain and he denied voiding complaints Follow-up urinalysis at his visit 02/06/2021 was unremarkable PSA at last visit was normal at 0.55   PMH: Past Medical History:  Diagnosis Date   Anemia    Arthritis    Asthma    Genital warts    Hepatitis    Obesity     Surgical History: Past Surgical History:  Procedure Laterality Date   COLONOSCOPY WITH PROPOFOL N/A 01/28/2020   Procedure: COLONOSCOPY WITH PROPOFOL;  Surgeon: [name], MD;  Location: [facility] ENDOSCOPY;  Service: Gastroenterology;  Laterality: N/A;   ESOPHAGOGASTRODUODENOSCOPY (EGD) WITH PROPOFOL N/A 10/21/2014   Procedure: ESOPHAGOGASTRODUODENOSCOPY (EGD) WITH PROPOFOL;  Surgeon: Josefine Class, MD;  Location: [facility] ENDOSCOPY;  Service: Endoscopy;  Laterality: N/A;   HIP SURGERY Left     Home Medications:  Allergies as of 03/29/2021   No Known Allergies      Medication List        Accurate as of March 29, 2021  6:10 PM. If you have any questions, ask your nurse or doctor.          STOP taking these medications    cyclobenzaprine 5 MG tablet Commonly known as: FLEXERIL Stopped by: Abbie Sons, MD   DULoxetine 30 MG  capsule Commonly known as: Cymbalta Stopped by: Abbie Sons, MD       TAKE these medications    naproxen sodium 220 MG tablet Commonly known as: ALEVE Take 220 mg by mouth.        Allergies: No Known Allergies  Family History: Family History  Problem Relation Age of Onset   Diabetes Mother    Diabetes Father    Diabetes Brother    Cancer Brother    Diabetes Brother    Colon cancer Neg Hx     Social History:  reports that he has quit smoking. His smoking use included cigars. He has never used smokeless tobacco. He reports current alcohol use. He reports that he does not use drugs.   Physical Exam: BP 120/76    Pulse 73    Ht 6\' 4"  (1.93 m)    Wt (!) 329 lb (149.2 kg)    BMI 40.05 kg/m   Constitutional:  Alert and oriented, No acute distress. HEENT: [place] AT, moist mucus membranes.  Trachea midline, no masses. Cardiovascular: No clubbing, cyanosis, or edema. Respiratory: Normal respiratory effort, no increased work of breathing. Psychiatric: Normal mood and affect.  Laboratory Data:  Lab Results  Component Value Date   PSA 0.55 02/06/2021   PSA 0.36 12/02/2019   Urinalysis Dipstick/microscopy negative   Assessment & Plan:    1. Urinary hesitancy Resolved No complaints today Urinalysis clear Bladder scan  PVR 0 mL Follow-up recommended should he have recurrent voiding symptoms for reassessment   Abbie Sons, MD  [facility] [address] [place], [place] [address] [phone]

## 2021-03-30 LAB — URINALYSIS, COMPLETE
Bilirubin, UA: NEGATIVE
Glucose, UA: NEGATIVE
Ketones, UA: NEGATIVE
Leukocytes,UA: NEGATIVE
Nitrite, UA: NEGATIVE
Protein,UA: NEGATIVE
RBC, UA: NEGATIVE
Specific Gravity, UA: >1.03 — ABNORMAL HIGH (ref 1.005–1.030)
Urobilinogen, Ur: 0.2 mg/dL (ref 0.2–1.0)
pH, UA: 6 (ref 5.0–7.5)

## 2021-03-30 LAB — MICROSCOPIC EXAMINATION
Bacteria, UA: NONE SEEN
Epithelial Cells (non renal): NONE SEEN /hpf (ref 0–10)
RBC, Urine: NONE SEEN /hpf (ref 0–2)

## 2021-04-12 ENCOUNTER — Ambulatory Visit: Payer: BC Managed Care – PPO | Admitting: Gastroenterology

## 2021-04-12 ENCOUNTER — Telehealth: Payer: Self-pay

## 2021-04-12 NOTE — Telephone Encounter (Signed)
Patient called and wanted me to make another APPOINTMENT

## 2021-04-18 DIAGNOSIS — G4733 Obstructive sleep apnea (adult) (pediatric): Secondary | ICD-10-CM | POA: Diagnosis not present

## 2021-04-20 ENCOUNTER — Telehealth: Payer: Self-pay | Admitting: Family

## 2021-04-21 ENCOUNTER — Encounter: Payer: Self-pay | Admitting: Family

## 2021-04-21 ENCOUNTER — Ambulatory Visit (INDEPENDENT_AMBULATORY_CARE_PROVIDER_SITE_OTHER): Payer: BC Managed Care – PPO | Admitting: Family

## 2021-04-21 ENCOUNTER — Other Ambulatory Visit: Payer: Self-pay

## 2021-04-21 VITALS — BP 130/76 | HR 67 | Temp 98.7°F | Ht 73.0 in | Wt 331.8 lb

## 2021-04-21 DIAGNOSIS — R159 Full incontinence of feces: Secondary | ICD-10-CM | POA: Insufficient documentation

## 2021-04-21 DIAGNOSIS — B009 Herpesviral infection, unspecified: Secondary | ICD-10-CM | POA: Diagnosis not present

## 2021-04-21 DIAGNOSIS — A630 Anogenital (venereal) warts: Secondary | ICD-10-CM

## 2021-04-21 MED ORDER — HYDROCORTISONE (PERIANAL) 2.5 % EX CREA: 1.0000 "application " | TOPICAL_CREAM | Freq: Two times a day (BID) | CUTANEOUS | 0 refills | Status: DC

## 2021-04-21 MED ORDER — HYDROCORTISONE ACETATE 25 MG RE SUPP: 25.0000 mg | Freq: Two times a day (BID) | RECTAL | 0 refills | Status: DC

## 2021-04-21 MED ORDER — VALACYCLOVIR HCL 1 G PO TABS: 1000.0000 mg | ORAL_TABLET | Freq: Every day | ORAL | 2 refills | Status: AC

## 2021-04-21 NOTE — Assessment & Plan Note (Signed)
More recent history of constipation, straining.  We agreed to empirically treat him with Metamucil to bulk his stool to prevent fecal incontinence.  Advised him to start Metamucil today.  We will also do a trial treatment of Anusol for suspected hemorrhoids.  Was able to feel a nontender mass right side of rectum internally.  Question whether this is hemorrhoidal.  If does not resolve with conservative therapy in the next couple weeks, patient and I agree that we would arrange sooner follow-up with Dr. Vicente Males, GI for further evaluation, consideration of biopsy if necessary.

## 2021-04-21 NOTE — Patient Instructions (Addendum)
Start metamucil 3.4 gram qd to twice per day to bulk stool and prevent leakage of stool   If continue to feel raw around rectum, you may trial barrier cream such as desitin.   Trial of anusol for hemorrhoids.

## 2021-04-21 NOTE — Assessment & Plan Note (Signed)
No lesions present today.  Counseled him that he is at risk of spreading disease to others when lesions are present.  I provided him with valacyclovir 1000 mg to take daily for 5 days with any future outbreak.

## 2021-04-21 NOTE — Progress Notes (Signed)
Subjective:    Patient ID: Seth Simmons, male    DOB: [birth year], 51 y.o.   MRN: [id number]  CC: Seth Simmons is a 51 y.o. male who presents today for follow up.   HPI: Concerns with fecal incontinence x 4-6 weeks.   Describes occasionally after he as a BM and then 10-15 mins later , he will wipe and see BR blood and brown loose BM on toilet paper.  Lately he has constipation x 6 weeks. More recently stool is loose.  No blood in stool. Stool is not coffee ground , tarry.  Occasional strain for BM.   Raw sensation on rectum No rectal pain, diarrhea, fever. Endorses rectal itching.   H/o genital herpes. He hasnt been on episodic or suppressive therapy for years. He reports getting one lesion near rectum and then resolves. Describes one lesion every 3-4 months.   He has dietary changes and eating healthier. He has lost 22lbs in 6 weeks. It is typical for him to loose quickly and then weight loss slows down.    Urinary hesitancy resolved. Consult with Dr Bernardo Heater 03/29/21 colonoscopy 01/2020 with Dr Vicente Males. Polyps. No  hemorrhoids. No endoscopy report   HISTORY:  Past Medical History:  Diagnosis Date   Anemia    Arthritis    Asthma    Genital warts    Hepatitis    Obesity    Past Surgical History:  Procedure Laterality Date   COLONOSCOPY WITH PROPOFOL N/A 01/28/2020   Procedure: COLONOSCOPY WITH PROPOFOL;  Surgeon: Seth Bellows, MD;  Location[facility] ENDOSCOPY;  Service: Gastroenterology;  Laterality: N/A;   ESOPHAGOGASTRODUODENOSCOPY (EGD) WITH PROPOFOL N/A 10/21/2014   Procedure: ESOPHAGOGASTRODUODENOSCOPY (EGD) WITH PROPOFOL;  Surgeon: Seth Class, MD;  Location: [facility] ENDOSCOPY;  Service: Endoscopy;  Laterality: N/A;   HIP SURGERY Left    Family History  Problem Relation Age of Onset   Diabetes Mother    Diabetes Father    Diabetes Brother    Cancer Brother    Diabetes Brother    Colon cancer Neg Hx     Allergies: Patient has no known allergies. Current  Outpatient Medications on File Prior to Visit  Medication Sig Dispense Refill   naproxen sodium (ALEVE) 220 MG tablet Take 220 mg by mouth.     No current facility-administered medications on file prior to visit.    Social History   Tobacco Use   Smoking status: Former    Types: Cigars   Smokeless tobacco: Never   Tobacco comments:    maybe 1 cigar every 3 months   Substance Use Topics   Alcohol use: Yes    Alcohol/week: 0.0 standard drinks    Comment: weekends;    Drug use: No    Review of Systems  Constitutional:  Negative for chills and fever.  HENT:  Negative for congestion, ear pain, rhinorrhea, sinus pressure and sore throat.   Respiratory:  Negative for cough, shortness of breath and wheezing.   Cardiovascular:  Negative for chest pain and palpitations.  Gastrointestinal:  Positive for anal bleeding, constipation and rectal pain. Negative for abdominal pain, blood in stool, diarrhea, nausea and vomiting.  Genitourinary:  Negative for difficulty urinating, dysuria and genital sores (h/o gential herpes).  Musculoskeletal:  Negative for myalgias.  Skin:  Negative for rash.  Neurological:  Negative for headaches.  Hematological:  Negative for adenopathy.     Objective:    BP 130/76 (BP Location: Left Arm, Patient Position: Sitting, Cuff Size: Large)  Pulse 67    Temp 98.7 F (37.1 C) (Oral)    Ht 6\' 1"  (1.854 m)    Wt (!) 331 lb 12.8 oz (150.5 kg)    SpO2 97%    BMI 43.78 kg/m  BP Readings from Last 3 Encounters:  04/21/21 130/76  03/29/21 120/76  02/06/21 118/72   Wt Readings from Last 3 Encounters:  04/21/21 (!) 331 lb 12.8 oz (150.5 kg)  03/29/21 (!) 329 lb (149.2 kg)  02/06/21 (!) 341 lb (154.7 kg)    Physical Exam Vitals reviewed.  Constitutional:      Appearance: He is well-developed.  Cardiovascular:     Rate and Rhythm: Regular rhythm.     Heart sounds: Normal heart sounds.  Pulmonary:     Effort: Pulmonary effort is normal. No respiratory  distress.     Breath sounds: Normal breath sounds. No wheezing, rhonchi or rales.  Genitourinary:    Rectum: Guaiac result negative. Mass present.       Comments: Palpable internal rectal mass 9 oclock right side rectum.  Negative guaiac. No other skin lesions appreciated around rectum.  Skin:    General: Skin is warm and dry.  Neurological:     Mental Status: He is alert.  Psychiatric:        Speech: Speech normal.        Behavior: Behavior normal.       Assessment & Plan:   Problem List Items Addressed This Visit       Musculoskeletal and Integument   Genital warts   Relevant Medications   valACYclovir (VALTREX) 1000 MG tablet     Other   Fecal incontinence - Primary    More recent history of constipation, straining.  We agreed to empirically treat him with Metamucil to bulk his stool to prevent fecal incontinence.  Advised him to start Metamucil today.  We will also do a trial treatment of Anusol for suspected hemorrhoids.  Was able to feel a nontender mass right side of rectum internally.  Question whether this is hemorrhoidal.  If does not resolve with conservative therapy in the next couple weeks, patient and I agree that we would arrange sooner follow-up with Dr. Vicente Males, GI for further evaluation, consideration of biopsy if necessary.       Relevant Medications   hydrocortisone (ANUSOL-HC) 25 MG suppository   hydrocortisone (ANUSOL-HC) 2.5 % rectal cream   Herpes simplex type 2 infection    No lesions present today.  Counseled him that he is at risk of spreading disease to others when lesions are present.  I provided him with valacyclovir 1000 mg to take daily for 5 days with any future outbreak.      Relevant Medications   valACYclovir (VALTREX) 1000 MG tablet     I am having Doylene Canard start on hydrocortisone, hydrocortisone, and valACYclovir. I am also having him maintain his naproxen sodium.   Meds ordered this encounter  Medications   hydrocortisone  (ANUSOL-HC) 25 MG suppository    Sig: Place 1 suppository (25 mg total) rectally 2 (two) times daily.    Dispense:  12 suppository    Refill:  0    Order Specific Question:   Supervising Provider    Answer:   Crecencio Mc [[id number]]   hydrocortisone (ANUSOL-HC) 2.5 % rectal cream    Sig: Place 1 application rectally 2 (two) times daily.    Dispense:  30 g    Refill:  0    Order Specific  Question:   Supervising Provider    Answer:   Crecencio Mc [[id number]]   valACYclovir (VALTREX) 1000 MG tablet    Sig: Take 1 tablet (1,000 mg total) by mouth daily for 5 days. For episodes    Dispense:  20 tablet    Refill:  2    Order Specific Question:   Supervising Provider    Answer:   Crecencio Mc [[id number]]    Return precautions given.   Risks, benefits, and alternatives of the medications and treatment plan prescribed today were discussed, and patient expressed understanding.   Education regarding symptom management and diagnosis given to patient on AVS.  Continue to follow with Burnard Hawthorne, FNP for routine health maintenance.   Doylene Canard and I agreed with plan.   [name], FNP

## 2021-05-03 DIAGNOSIS — G4733 Obstructive sleep apnea (adult) (pediatric): Secondary | ICD-10-CM | POA: Diagnosis not present

## 2021-05-12 ENCOUNTER — Other Ambulatory Visit: Payer: Self-pay

## 2021-05-12 ENCOUNTER — Ambulatory Visit (INDEPENDENT_AMBULATORY_CARE_PROVIDER_SITE_OTHER): Payer: BC Managed Care – PPO | Admitting: Family

## 2021-05-12 ENCOUNTER — Encounter: Payer: Self-pay | Admitting: Family

## 2021-05-12 DIAGNOSIS — R159 Full incontinence of feces: Secondary | ICD-10-CM | POA: Diagnosis not present

## 2021-05-12 NOTE — Patient Instructions (Addendum)
For rectal itching- ? ?Avoid tight fitting garments ( boxers) .  Keep area dry and clean.  I would advise using a barrier cream with zinc oxide.  You may purchase over-the-counter Desitin (or generic thereof) ? ?Start metamucil 3.4 gram qd to twice per day to bulk stool and prevent leakage of stool  ?

## 2021-05-12 NOTE — Progress Notes (Signed)
? ?Subjective:  ? ? Patient ID: Seth Simmons, male    DOB: June 25, 1970, 51 y.o.   MRN: [id number] ? ?CC: Seth Simmons is a 51 y.o. male who presents today for follow up.  ? ?HPI: Fecal incontinence is less frequent ?Stool is brown, soft, formed. Occasionally will have to wipe after BM with brown 'residue', which has improved. Non bloody. ?Intermittent rectal itching ?He didn't start metamucil. He completed anusol  ? ? ?He has gained 5 lbs ? ?HISTORY:  ?Past Medical History:  ?Diagnosis Date  ? Anemia   ? Arthritis   ? Asthma   ? Genital warts   ? Hepatitis   ? Obesity   ? ?Past Surgical History:  ?Procedure Laterality Date  ? COLONOSCOPY WITH PROPOFOL N/A 01/28/2020  ? Procedure: COLONOSCOPY WITH PROPOFOL;  Surgeon: [name], MD;  Location: [facility] ENDOSCOPY;  Service: Gastroenterology;  Laterality: N/A;  ? ESOPHAGOGASTRODUODENOSCOPY (EGD) WITH PROPOFOL N/A 10/21/2014  ? Procedure: ESOPHAGOGASTRODUODENOSCOPY (EGD) WITH PROPOFOL;  Surgeon: Josefine Class, MD;  Location: [facility] ENDOSCOPY;  Service: Endoscopy;  Laterality: N/A;  ? HIP SURGERY Left   ? ?Family History  ?Problem Relation Age of Onset  ? Diabetes Mother   ? Diabetes Father   ? Diabetes Brother   ? Cancer Brother   ? Diabetes Brother   ? Colon cancer Neg Hx   ? ? ?Allergies: Patient has no known allergies. ?Current Outpatient Medications on File Prior to Visit  ?Medication Sig Dispense Refill  ? hydrocortisone (ANUSOL-HC) 2.5 % rectal cream Place 1 application rectally 2 (two) times daily. 30 g 0  ? hydrocortisone (ANUSOL-HC) 25 MG suppository Place 1 suppository (25 mg total) rectally 2 (two) times daily. 12 suppository 0  ? naproxen sodium (ALEVE) 220 MG tablet Take 220 mg by mouth.    ? ?No current facility-administered medications on file prior to visit.  ? ? ?Social History  ? ?Tobacco Use  ? Smoking status: Former  ?  Types: Cigars  ? Smokeless tobacco: Never  ? Tobacco comments:  ?  maybe 1 cigar every 3 months   ?Substance Use Topics  ? Alcohol  use: Yes  ?  Alcohol/week: 0.0 standard drinks  ?  Comment: weekends;   ? Drug use: No  ? ? ?Review of Systems  ?Constitutional:  Negative for chills and fever.  ?Respiratory:  Negative for cough.   ?Cardiovascular:  Negative for chest pain and palpitations.  ?Gastrointestinal:  Negative for abdominal pain, anal bleeding, blood in stool, constipation, diarrhea, nausea, rectal pain and vomiting.  ?   ?Objective:  ?  ?BP 138/70 (BP Location: Left Arm, Patient Position: Sitting, Cuff Size: Normal)   Pulse 80   Temp 98.3 ?F (36.8 ?C) (Oral)   Ht '6\' 4"'$  (1.93 m)   Wt (!) 336 lb (152.4 kg)   SpO2 97%   BMI 40.90 kg/m?  ?BP Readings from Last 3 Encounters:  ?05/12/21 138/70  ?04/21/21 130/76  ?03/29/21 120/76  ? ?Wt Readings from Last 3 Encounters:  ?05/12/21 (!) 336 lb (152.4 kg)  ?04/21/21 (!) 331 lb 12.8 oz (150.5 kg)  ?03/29/21 (!) 329 lb (149.2 kg)  ? ? ?Physical Exam ?Vitals reviewed.  ?Constitutional:   ?   Appearance: He is well-developed.  ?Cardiovascular:  ?   Rate and Rhythm: Regular rhythm.  ?   Heart sounds: Normal heart sounds.  ?Pulmonary:  ?   Effort: Pulmonary effort is normal. No respiratory distress.  ?   Breath sounds: Normal breath sounds. No  wheezing, rhonchi or rales.  ?Genitourinary: ?   Rectum: No anal fissure or external hemorrhoid.  ?   Comments: No skin lesion,mass, erythema around rectum or surrounding skin ?Skin: ?   General: Skin is warm and dry.  ?Neurological:  ?   Mental Status: He is alert.  ?Psychiatric:     ?   Speech: Speech normal.     ?   Behavior: Behavior normal.  ? ? ?   ?Assessment & Plan:  ? ?Problem List Items Addressed This Visit   ? ?  ? Other  ? Fecal incontinence  ?  Fecal incontinence seems to have improved somewhat.  He completed treatment for suspected hemorrhoids. Benign exam. If anal pruritus doesn't improve, may consider topical hydrocortisone for a very short period of time.  Upon further reading, capsaicin topical could be used and I may consider this in the  future as well.  Upcoming appointment with gastroenterology, Dr. Vicente Males and encouraged him to keep this appointment further recommendations and certainly if Dr. Vicente Males feels further evaluation including anoscopy, rectal biopsy would be necessary.   ?  ?  ? ? ? ?I am having Seth Simmons maintain his naproxen sodium, hydrocortisone, and hydrocortisone. ? ? ?No orders of the defined types were placed in this encounter. ? ? ?Return precautions given.  ? ?Risks, benefits, and alternatives of the medications and treatment plan prescribed today were discussed, and patient expressed understanding.  ? ?Education regarding symptom management and diagnosis given to patient on AVS. ? ?Continue to follow with Burnard Hawthorne, FNP for routine health maintenance.  ? ?Seth Simmons and I agreed with plan.  ? ?[name], FNP ? ? ?

## 2021-05-12 NOTE — Assessment & Plan Note (Addendum)
Fecal incontinence seems to have improved somewhat.  He completed treatment for suspected hemorrhoids. Benign exam. ?He did not start Metamucil at last visit, encouraged him the importance of doing this as a bulking agent to decrease fecal incontinence.  Advised also to start barrier cream, with zinc oxide such as Desitin.  ? If anal pruritus doesn't improve, may consider topical hydrocortisone for a very short period of time.  Upon further reading, capsaicin topical could be used and I may consider this in the future as well.  Upcoming appointment with gastroenterology, Dr. Vicente Males and encouraged him to keep this appointment further recommendations and certainly if Dr. Vicente Males feels further evaluation including anoscopy, rectal biopsy would be necessary.   ?

## 2021-05-16 DIAGNOSIS — G4733 Obstructive sleep apnea (adult) (pediatric): Secondary | ICD-10-CM | POA: Diagnosis not present

## 2021-05-23 DIAGNOSIS — G4733 Obstructive sleep apnea (adult) (pediatric): Secondary | ICD-10-CM | POA: Diagnosis not present

## 2021-06-23 DIAGNOSIS — G4733 Obstructive sleep apnea (adult) (pediatric): Secondary | ICD-10-CM | POA: Diagnosis not present

## 2021-06-27 ENCOUNTER — Encounter: Payer: Self-pay | Admitting: Gastroenterology

## 2021-06-27 ENCOUNTER — Ambulatory Visit (INDEPENDENT_AMBULATORY_CARE_PROVIDER_SITE_OTHER): Payer: BC Managed Care – PPO | Admitting: Gastroenterology

## 2021-06-27 VITALS — BP 120/79 | HR 70 | Temp 98.3°F | Wt 338.0 lb

## 2021-06-27 DIAGNOSIS — K5900 Constipation, unspecified: Secondary | ICD-10-CM

## 2021-06-27 DIAGNOSIS — K648 Other hemorrhoids: Secondary | ICD-10-CM | POA: Diagnosis not present

## 2021-06-27 NOTE — Progress Notes (Signed)
?  ?Seth Bellows MD, MRCP(U.K) ?[address]  ?[address]  ?[place], [place] [address]  ?Main: [phone]  ?Fax: [phone] ? ? ?Primary Care Physician: Burnard Hawthorne, FNP ? ?Primary Gastroenterologist:  Dr. Jonathon Simmons  ? ?Chief Complaint  ?Patient presents with  ? Diarrhea  ? ? ?HPI: Seth Simmons is a 51 y.o. male ? ? ?Summary of history : ? ?Initially referred and seen back in November 2022.Previously had been seen by gastroenterologist at [facility].  Back in March 2022.  Reviewing his last office note it states that he had chronic hepatitis C treated with Harvoni and ribavirin for 12 weeks and SVR attained.  Elastography in 2016 showed some F3 and F4 fibrosis.  EGD in 2016 showed normal esophagus.  Right upper quadrant ultrasound in 2017 showed mild cirrhotic changes but an ultrasound abdomen with elastography in 2019 showed F0 F1 fibrosis, hemangioma but no clear evidence of cirrhosis.  Completed Twinrix immunization for hepatitis A and B. 2020 he was drinking 2 beers on weekends.  The plan at the last office visit was to evaluate for nonalcoholic fatty liver disease and obtain ultrasound and MRI in 12 months. ? ?12/03/2018 right upper quadrant ultrasound shows benign hemangioma left lobe of the liver fatty infiltration of the liver. ? ?Labs ?12/02/2019 hemoglobin 13.6 g with a platelet count of 166, albumin 4.2 normal LFTs. ?He has gained over 40 to 50 pounds of weight for the past few months.  He drinks about 8 beers only during the weekends.  No illegal drug use.  No other complaints. ? ?Interval history   01/15/2021-06/27/2021 ?01/09/2021: Hepatitis A total antibody positive, hepatitis C viral load not detected, hepatitis B surface antibody reactive.  CBC, CMP normal PT/INR 0.9. ? ? ? ?Immune to hepatitis A and B.  05/12/2021 seen by his primary care provider for fecal incontinence.  Suspected hemorrhoids.  Colonoscopy in 02/15/2020 showed 2 small polyps resected.  Hemorrhoids noted on  retroflexion. ? ? ?His main complaints presently is that he has discomfort in form of irritation after he wipes.  Perianal itching.  Sometimes seepage as well.  Denies any blood on the stool sometimes has a bit of blood on the tissue paper.  Has tried hydrocortisone suppository and cream which has helped a bit.  Also his history suggests he possibly has constipation.  Did have a lot of food and vegetable in the past and his diet has reduced. ? ?Current Outpatient Medications  ?Medication Sig Dispense Refill  ? naproxen sodium (ALEVE) 220 MG tablet Take 220 mg by mouth.    ? ?No current facility-administered medications for this visit.  ? ? ?Allergies as of 06/27/2021  ? (No Known Allergies)  ? ? ?ROS: ? ?General: Negative for anorexia, weight loss, fever, chills, fatigue, weakness. ?ENT: Negative for hoarseness, difficulty swallowing , nasal congestion. ?CV: Negative for chest pain, angina, palpitations, dyspnea on exertion, peripheral edema.  ?Respiratory: Negative for dyspnea at rest, dyspnea on exertion, cough, sputum, wheezing.  ?GI: See history of present illness. ?GU:  Negative for dysuria, hematuria, urinary incontinence, urinary frequency, nocturnal urination.  ?Endo: Negative for unusual weight change.  ?  ?Physical Examination: ? ? BP 120/79   Pulse 70   Temp 98.3 ?F (36.8 ?C) (Oral)   Wt (!) 338 lb (153.3 kg)   BMI 41.14 kg/m?  ? ?General: Well-nourished, well-developed in no acute distress.  ?Eyes: No icterus. Conjunctivae pink. ?Psych: Alert and cooperative, normal mood and affect. ?Chaperone present in the  room CMA Maritza: External anal exam showed no abnormalities no skin tags or polyps noted on insertion of my index finger no pain or discomfort felt no masses palpable.  Anoscope was introduced.  No abnormalities noted. ? ?Imaging Studies: ?No results found. ? ?Assessment and Plan:  ? ?Seth Simmons is a 51 y.o. y/o male with a history of chronic hepatitis C status posttreatment and attained SVR  a few years back.  At part of the evaluation back in 2016 he had ultrasound which showed mild cirrhotic changes and concern for F3 fibrosis.  Subsequent ultrasounds have not shown any evidence of nodularity or portal hypertension.  Labs in 2021 showed a normal platelet count, normal albumin level normal bilirubin level and creatinine.  His labs back in 2021+ imaging make it less likely that he has advanced fibrosis.  There is no clear evidence of portal hypertension.  He does have fatty infiltration of the liver.  He has completed vaccination for hepatitis a and B.  He probably at this point of time has fatty liver disease due to combination of effects of nutritional calories and alcohol.  Ultrasound in November 2022 showed hepatic steatosis. ? ?Today his main reason to see me is seepage from the perianal area, perianal itching, possible constipation.  He does have issues likely related to internal hemorrhoids.  Has tried conservative management which is helped to an extent but not completely.  Based on my examination I feel the presentation was predominantly constipation. ? ?Plan ?1.  High-fiber diet patient information provided ?2.  Trial of Linzess 145 mcg.Internal hemorrhoids: Suggest conservative management with high-fiber diet with an aim to get at least 25 to 30 g of fiber per day.  Discussed about it in depth and provided patient information.  In addition counseled on perianal hygiene and management including using good quality soft toilet paper, cleaning the perianal area after bowel movement 2-3 times gently and if further cleansing required to use a showerhead to wash the area and pat dry with a cotton cloth/towel.  Suggest sitting in a warm water bath or a sitz bath at night for 15 to 20 minutes which would help decrease inflammation.   Usually conservative management helps resolve the issues with internal hemorrhoids and 50 to 60% of patients.  If it fails we could consider banding at the office.  ?3.   If above interventions do not work in 4 to 6 weeks at follow-up visit we will consider banding of the hemorrhoids ? ? ?Dr Seth Bellows  MD,MRCP [facility]) ?Follow up in 4-6 ?

## 2021-06-27 NOTE — Patient Instructions (Signed)
Please take Linzess 145 mcg 1 capsule 30 minutes before breakfast daily. Please let us know if it is working for you so we could send you a prescription. ?

## 2021-07-23 ENCOUNTER — Encounter: Payer: Self-pay | Admitting: Gastroenterology

## 2021-07-23 DIAGNOSIS — G4733 Obstructive sleep apnea (adult) (pediatric): Secondary | ICD-10-CM | POA: Diagnosis not present

## 2021-07-25 ENCOUNTER — Other Ambulatory Visit: Payer: Self-pay

## 2021-07-25 MED ORDER — LINACLOTIDE 145 MCG PO CAPS: 145.0000 ug | ORAL_CAPSULE | Freq: Every day | ORAL | 3 refills | Status: DC

## 2021-07-27 DIAGNOSIS — J40 Bronchitis, not specified as acute or chronic: Secondary | ICD-10-CM | POA: Diagnosis not present

## 2021-07-27 DIAGNOSIS — J329 Chronic sinusitis, unspecified: Secondary | ICD-10-CM | POA: Diagnosis not present

## 2021-07-27 DIAGNOSIS — B9689 Other specified bacterial agents as the cause of diseases classified elsewhere: Secondary | ICD-10-CM | POA: Diagnosis not present

## 2021-08-02 ENCOUNTER — Other Ambulatory Visit: Payer: Self-pay

## 2021-08-10 ENCOUNTER — Ambulatory Visit: Payer: BC Managed Care – PPO | Admitting: Gastroenterology

## 2021-12-07 DIAGNOSIS — G4733 Obstructive sleep apnea (adult) (pediatric): Secondary | ICD-10-CM | POA: Diagnosis not present

## 2022-01-07 DIAGNOSIS — G4733 Obstructive sleep apnea (adult) (pediatric): Secondary | ICD-10-CM | POA: Diagnosis not present

## 2022-01-08 ENCOUNTER — Ambulatory Visit (INDEPENDENT_AMBULATORY_CARE_PROVIDER_SITE_OTHER): Payer: BC Managed Care – PPO | Admitting: Family

## 2022-01-08 ENCOUNTER — Encounter: Payer: Self-pay | Admitting: Family

## 2022-01-08 VITALS — BP 128/78 | HR 90 | Temp 98.3°F | Ht 76.0 in | Wt 336.4 lb

## 2022-01-08 DIAGNOSIS — R7303 Prediabetes: Secondary | ICD-10-CM

## 2022-01-08 DIAGNOSIS — K7400 Hepatic fibrosis, unspecified: Secondary | ICD-10-CM

## 2022-01-08 DIAGNOSIS — M545 Low back pain, unspecified: Secondary | ICD-10-CM

## 2022-01-08 DIAGNOSIS — Z125 Encounter for screening for malignant neoplasm of prostate: Secondary | ICD-10-CM | POA: Diagnosis not present

## 2022-01-08 DIAGNOSIS — M25561 Pain in right knee: Secondary | ICD-10-CM

## 2022-01-08 DIAGNOSIS — Z0001 Encounter for general adult medical examination with abnormal findings: Secondary | ICD-10-CM

## 2022-01-08 DIAGNOSIS — Z0000 Encounter for general adult medical examination without abnormal findings: Secondary | ICD-10-CM

## 2022-01-08 MED ORDER — DULOXETINE HCL 30 MG PO CPEP: 30.0000 mg | ORAL_CAPSULE | Freq: Every day | ORAL | 1 refills | Status: DC

## 2022-01-08 MED ORDER — METFORMIN HCL ER 500 MG PO TB24: 1000.0000 mg | ORAL_TABLET | Freq: Two times a day (BID) | ORAL | 3 refills | Status: DC

## 2022-01-08 MED ORDER — ALPRAZOLAM 0.5 MG PO TABS: 0.5000 mg | ORAL_TABLET | Freq: Once | ORAL | 0 refills | Status: AC

## 2022-01-08 NOTE — Progress Notes (Signed)
Subjective:    Patient ID: Seth Simmons, male    DOB: 10/13/1970, 51 y.o.   MRN: [id number]  CC: Seth Simmons is a 51 y.o. male who presents today for physical exam.    HPI: Complains of chronic low back , dull pain. Pain has improved somewhat but some days is worse than others.  No numbness in legs, saddle anesthesia, weakness, injury, fall, trouble urinating hesitancy  He has gained weight he thinks this has made worse.  He complains recently of right knee pain and his knee has felt like it will give way.  No knee swelling.  No injury    Taking aleve daily or every other day.  Previously tried Cymbalta 30 mg and does not recall it to be effective.  Previously declines MRI lumbar.    Colorectal  Cancer Screening: UTD , repeat in 7 years, Seth Simmons.  Prostate Cancer Screening: No Urinary hesitancy, decreased urine stream  Lung Cancer Screening: No 30 year pack year history and > 50 years to 80 years. Cigar smoker, 1 cigar every 3 months.    Immunizations       Tetanus - UTD        Pneumococcal - Candidate for. Declines  Labs: Screening labs today. 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   Alcohol use:  occasional    HISTORY:  Past Medical History:  Diagnosis Date   Anemia    Arthritis    Asthma    Genital warts    Hepatitis    Obesity     Past Surgical History:  Procedure Laterality Date   COLONOSCOPY WITH PROPOFOL N/A 01/28/2020   Procedure: COLONOSCOPY WITH PROPOFOL;  Surgeon: Seth Bellows, MD;  Location: [facility] ENDOSCOPY;  Service: Gastroenterology;  Laterality: N/A;   ESOPHAGOGASTRODUODENOSCOPY (EGD) WITH PROPOFOL N/A 10/21/2014   Procedure: ESOPHAGOGASTRODUODENOSCOPY (EGD) WITH PROPOFOL;  Surgeon: Seth Class, MD;  Location: [facility] ENDOSCOPY;  Service: Endoscopy;  Laterality: N/A;   HIP SURGERY Left    Family History  Problem Relation Age of Onset   Diabetes Mother    Diabetes Father    Diabetes Brother     Cancer Brother    Diabetes Brother    Colon cancer Neg Hx       ALLERGIES: Patient has no known allergies.  Current Outpatient Medications on File Prior to Visit  Medication Sig Dispense Refill   albuterol (VENTOLIN HFA) 108 (90 Base) MCG/ACT inhaler Inhale 2 puffs into the lungs every 6 (six) hours as needed.     linaclotide (LINZESS) 145 MCG CAPS capsule Take 1 capsule (145 mcg total) by mouth daily. 90 capsule 3   naproxen sodium (ALEVE) 220 MG tablet Take 220 mg by mouth.     promethazine-dextromethorphan (PROMETHAZINE-DM) 6.25-15 MG/5ML syrup Take 5 mLs by mouth every 6 (six) hours as needed.     No current facility-administered medications on file prior to visit.    Social History   Tobacco Use   Smoking status: Former    Types: Cigars   Smokeless tobacco: Never   Tobacco comments:    maybe 1 cigar every 3 months , last one 2022.   Substance Use Topics   Alcohol use: Yes    Alcohol/week: 0.0 standard drinks of alcohol    Comment: weekends;    Drug use: No    Review of Systems  Constitutional:  Negative for chills and fever.  Respiratory:  Negative for cough.   Cardiovascular:  Negative for chest  pain and palpitations.  Gastrointestinal:  Negative for nausea and vomiting.  Genitourinary:  Negative for difficulty urinating and urgency.  Musculoskeletal:  Positive for arthralgias and back pain.      Objective:    BP 128/78 (BP Location: Left Arm, Patient Position: Sitting, Cuff Size: Normal)   Pulse 90   Temp 98.3 F (36.8 C) (Oral)   Ht '6\' 4"'$  (1.93 m)   Wt (!) 336 lb 6.4 oz (152.6 kg)   SpO2 97%   BMI 40.95 kg/m   BP Readings from Last 3 Encounters:  01/08/22 128/78  06/27/21 120/79  05/12/21 138/70   Wt Readings from Last 3 Encounters:  01/08/22 (!) 336 lb 6.4 oz (152.6 kg)  06/27/21 (!) 338 lb (153.3 kg)  05/12/21 (!) 336 lb (152.4 kg)    Physical Exam Vitals reviewed.  Constitutional:      Appearance: He is well-developed.  Neck:      Thyroid: No thyroid mass or thyromegaly.  Cardiovascular:     Rate and Rhythm: Regular rhythm.     Heart sounds: Normal heart sounds.  Pulmonary:     Effort: Pulmonary effort is normal. No respiratory distress.     Breath sounds: Normal breath sounds. No wheezing, rhonchi or rales.  Musculoskeletal:     Right knee: Crepitus present. No swelling, erythema or bony tenderness.     Left knee: No swelling, erythema or bony tenderness.     Comments: Bilateral knees are symmetric. No effusion appreciated. No increase in warmth or erythema. Crepitus felt with flexion of right knee.  Right knee:  Able to extend to -5 to 10 degrees and flex to 110 degrees. No catching with McMurray maneuver. No patellar apprehension. Negative anterior drawer and lachman's- no laxity appreciated.  No calf tenderness of lower leg edema bilaterally.    Lymphadenopathy:     Head:     Right side of head: No submental, submandibular, tonsillar, preauricular, posterior auricular or occipital adenopathy.     Left side of head: No submental, submandibular, tonsillar, preauricular, posterior auricular or occipital adenopathy.     Cervical: No cervical adenopathy.  Skin:    General: Skin is warm and dry.  Neurological:     Mental Status: He is alert.  Psychiatric:        Speech: Speech normal.        Behavior: Behavior normal.        Assessment & Plan:   Problem List Items Addressed This Visit       Digestive   Liver fibrosis   Relevant Orders   CBC with Differential/Platelet     Other   Low back pain    Chronic, uncontrolled. Pending MRI lumbar spine.  He is claustrophobic and I provided him with Xanax 0.5 mg for him to take prior to MRI . retrial cymbalta and titrate to '60mg'$  as we monitor LFTs.  Discussed weight loss as part of  treating low back pain. Provided exercises.   I looked up patient on [place] Controlled Substances Reporting System PMP AWARE and saw no activity that raised concern of inappropriate  use.        Relevant Medications   ALPRAZolam (XANAX) 0.5 MG tablet   DULoxetine (CYMBALTA) 30 MG capsule   Other Relevant Orders   MR Lumbar Spine Wo Contrast   Prediabetes    Pending A1c. H/o preDM.  We opted to trial metformin to help curb appetite.  Counseled on side effects and how to slowly titrate medication.  Relevant Medications   metFORMIN (GLUCOPHAGE-XR) 500 MG 24 hr tablet   Other Relevant Orders   Hemoglobin A1c   Comprehensive metabolic panel   Lipid panel   Right knee pain    Presentation consistent with meniscal etiology.  Suspect degenerative changes as well.  Patient to continue taking naproxen as needed.  We discussed weight loss as part of treatment as well.  Provided exercises for him.  He politely declines any imaging at this time.      Routine physical examination - Primary    Colonoscopy is up-to-date.  Pending screening labs.Discussed weight loss medication, metformin.       Other Visit Diagnoses     Screening for prostate cancer       Relevant Orders   PSA        I am having Doylene Canard start on metFORMIN, ALPRAZolam, and DULoxetine. I am also having him maintain his naproxen sodium, linaclotide, albuterol, and promethazine-dextromethorphan.   Meds ordered this encounter  Medications   metFORMIN (GLUCOPHAGE-XR) 500 MG 24 hr tablet    Sig: Take 2 tablets (1,000 mg total) by mouth 2 (two) times daily.    Dispense:  360 tablet    Refill:  3    Order Specific Question:   Supervising Provider    Answer:   Deborra Medina L [[id number]]   ALPRAZolam (XANAX) 0.5 MG tablet    Sig: Take 1 tablet (0.5 mg total) by mouth once for 1 dose. Take 15 - 30 minutes prior to appointment time.    Dispense:  1 tablet    Refill:  0    Order Specific Question:   Supervising Provider    Answer:   Deborra Medina L [[id number]]   DULoxetine (CYMBALTA) 30 MG capsule    Sig: Take 1 capsule (30 mg total) by mouth daily.    Dispense:  90 capsule    Refill:  1    Order  Specific Question:   Supervising Provider    Answer:   Crecencio Mc [[id number]]    Return precautions given.   Risks, benefits, and alternatives of the medications and treatment plan prescribed today were discussed, and patient expressed understanding.   Education regarding symptom management and diagnosis given to patient on AVS.   Continue to follow with Burnard Hawthorne, FNP for routine health maintenance.   Doylene Canard and I agreed with plan.   [name], FNP

## 2022-01-08 NOTE — Assessment & Plan Note (Signed)
Colonoscopy is up-to-date.  Pending screening labs.Discussed weight loss medication, metformin.

## 2022-01-08 NOTE — Assessment & Plan Note (Signed)
Presentation consistent with meniscal etiology.  Suspect degenerative changes as well.  Patient to continue taking naproxen as needed.  We discussed weight loss as part of treatment as well.  Provided exercises for him.  He politely declines any imaging at this time.

## 2022-01-08 NOTE — Patient Instructions (Addendum)
We can resume cymbalta '30mg'$ . After a couple of weeks, please increase to '60mg'$  daily to see if this helps with pain. We will monitor your liver enzymes when you come in for labs in December. If ineffective at '60mg'$ , he would need to decrease to '30mg'$  again and then after 5-6 days, you can discontinue altogether if not helpful.     Please make sure that you titrate per below so not to cause any GI upset.  The instructions on the metformin bottle however say metformin 2000 mg daily so that you do not run out of medication when you are on maximum dose but you will need to titrate to get there.    Lets trial metformin  Start metformin XR with one '500mg'$  tablet at night. After one week, you may increase to two tablets at night ( total of '1000mg'$ ) . The third week, you may take take two tablets at night and one tablet in the morning.  The fourth week, you may take two tablets in the morning ( '1000mg'$  total) and two tablets at night ('1000mg'$  total). This will bring you to a maximum daily dose of '2000mg'$ /day which is maximum dose. Along the way, if you want to increase more slowly, please do as this medication can cause GI discomfort and loose stools which usually get better with time , however some patients find that they can only tolerate a certain dose and cannot increase to maximum dose.    I have ordered the MRI lumbar spine.   Let us know if you dont hear back within a week in regards to an appointment being scheduled.   So that you are aware, if you are [facility] MyChart user , please pay attention to your MyChart messages as you may receive a MyChart message with a phone number to call and schedule this test/appointment own your own from our referral coordinator. This is a new process so I do not want you to miss this message.  If you are not a MyChart user, you will receive a phone call.   Health Maintenance, Male Adopting a healthy lifestyle and getting preventive care are important in promoting health and  wellness. Ask your health care provider about: The right schedule for you to have regular tests and exams. Things you can do on your own to prevent diseases and keep yourself healthy. What should I know about diet, weight, and exercise? Eat a healthy diet  Eat a diet that includes plenty of vegetables, fruits, low-fat dairy products, and lean protein. Do not eat a lot of foods that are high in solid fats, added sugars, or sodium. Maintain a healthy weight Body mass index (BMI) is a measurement that can be used to identify possible weight problems. It estimates body fat based on height and weight. Your health care provider can help determine your BMI and help you achieve or maintain a healthy weight. Get regular exercise Get regular exercise. This is one of the most important things you can do for your health. Most adults should: Exercise for at least 150 minutes each week. The exercise should increase your heart rate and make you sweat (moderate-intensity exercise). Do strengthening exercises at least twice a week. This is in addition to the moderate-intensity exercise. Spend less time sitting. Even light physical activity can be beneficial. Watch cholesterol and blood lipids Have your blood tested for lipids and cholesterol at 50 years of age, then have this test every 5 years. You may need to have your  cholesterol levels checked more often if: Your lipid or cholesterol levels are high. You are older than 50 years of age. You are at high risk for heart disease. What should I know about cancer screening? Many types of cancers can be detected early and may often be prevented. Depending on your health history and family history, you may need to have cancer screening at various ages. This may include screening for: Colorectal cancer. Prostate cancer. Skin cancer. Lung cancer. What should I know about heart disease, diabetes, and high blood pressure? Blood pressure and heart disease High  blood pressure causes heart disease and increases the risk of stroke. This is more likely to develop in people who have high blood pressure readings or are overweight. Talk with your health care provider about your target blood pressure readings. Have your blood pressure checked: Every 3-5 years if you are 86-74 years of age. Every year if you are 10 years old or older. If you are between the ages of 43 and 53 and are a current or former smoker, ask your health care provider if you should have a one-time screening for abdominal aortic aneurysm (AAA). Diabetes Have regular diabetes screenings. This checks your fasting blood sugar level. Have the screening done: Once every three years after age 82 if you are at a normal weight and have a low risk for diabetes. More often and at a younger age if you are overweight or have a high risk for diabetes. What should I know about preventing infection? Hepatitis B If you have a higher risk for hepatitis B, you should be screened for this virus. Talk with your health care provider to find out if you are at risk for hepatitis B infection. Hepatitis C Blood testing is recommended for: Everyone born from [birth year] through 1964. Anyone with known risk factors for hepatitis C. Sexually transmitted infections (STIs) You should be screened each year for STIs, including gonorrhea and chlamydia, if: You are sexually active and are younger than 50 years of age. You are older than 50 years of age and your health care provider tells you that you are at risk for this type of infection. Your sexual activity has changed since you were last screened, and you are at increased risk for chlamydia or gonorrhea. Ask your health care provider if you are at risk. Ask your health care provider about whether you are at high risk for HIV. Your health care provider may recommend a prescription medicine to help prevent HIV infection. If you choose to take medicine to prevent HIV, you  should first get tested for HIV. You should then be tested every 3 months for as long as you are taking the medicine. Follow these instructions at home: Alcohol use Do not drink alcohol if your health care provider tells you not to drink. If you drink alcohol: Limit how much you have to 0-2 drinks a day. Know how much alcohol is in your drink. In the [place] one drink equals one 12 oz bottle of beer (355 mL), one 5 oz glass of wine (148 mL), or one 1 oz glass of hard liquor (44 mL). Lifestyle Do not use any products that contain nicotine or tobacco. These products include cigarettes, chewing tobacco, and vaping devices, such as e-cigarettes. If you need help quitting, ask your health care provider. Do not use street drugs. Do not share needles. Ask your health care provider for help if you need support or information about quitting drugs. General instructions Schedule regular  health, dental, and eye exams. Stay current with your vaccines. Tell your health care provider if: You often feel depressed. You have ever been abused or do not feel safe at home. Summary Adopting a healthy lifestyle and getting preventive care are important in promoting health and wellness. Follow your health care provider's instructions about healthy diet, exercising, and getting tested or screened for diseases. Follow your health care provider's instructions on monitoring your cholesterol and blood pressure. This information is not intended to replace advice given to you by your health care provider. Make sure you discuss any questions you have with your health care provider. Document Revised: 07/04/2020 Document Reviewed: 07/04/2020 Elsevier Patient Education  [place].

## 2022-01-08 NOTE — Assessment & Plan Note (Signed)
Pending A1c. H/o preDM.  We opted to trial metformin to help curb appetite.  Counseled on side effects and how to slowly titrate medication.

## 2022-01-08 NOTE — Assessment & Plan Note (Addendum)
Chronic, uncontrolled. Pending MRI lumbar spine.  He is claustrophobic and I provided him with Xanax 0.5 mg for him to take prior to MRI . retrial cymbalta and titrate to '60mg'$  as we monitor LFTs.  Discussed weight loss as part of  treating low back pain. Provided exercises.   I looked up patient on [place] Controlled Substances Reporting System PMP AWARE and saw no activity that raised concern of inappropriate use.

## 2022-01-29 ENCOUNTER — Other Ambulatory Visit (INDEPENDENT_AMBULATORY_CARE_PROVIDER_SITE_OTHER): Payer: BC Managed Care – PPO

## 2022-01-29 DIAGNOSIS — R7303 Prediabetes: Secondary | ICD-10-CM | POA: Diagnosis not present

## 2022-01-29 DIAGNOSIS — Z125 Encounter for screening for malignant neoplasm of prostate: Secondary | ICD-10-CM | POA: Diagnosis not present

## 2022-01-29 DIAGNOSIS — K7400 Hepatic fibrosis, unspecified: Secondary | ICD-10-CM

## 2022-01-29 LAB — LIPID PANEL
Cholesterol: 171 mg/dL (ref 0–200)
HDL: 59.8 mg/dL (ref >39.00)
LDL Cholesterol: 86 mg/dL (ref 0–99)
NonHDL: 111.4
Total CHOL/HDL Ratio: 3
Triglycerides: 126 mg/dL (ref 0.0–149.0)
VLDL: 25.2 mg/dL (ref 0.0–40.0)

## 2022-01-29 LAB — CBC WITH DIFFERENTIAL/PLATELET
Basophils Absolute: 0 10*3/uL (ref 0.0–0.1)
Basophils Relative: 0.6 % (ref 0.0–3.0)
Eosinophils Absolute: 0.1 10*3/uL (ref 0.0–0.7)
Eosinophils Relative: 2 % (ref 0.0–5.0)
HCT: 40.6 % (ref 39.0–52.0)
Hemoglobin: 13.6 g/dL (ref 13.0–17.0)
Lymphocytes Relative: 33.7 % (ref 12.0–46.0)
Lymphs Abs: 1.6 10*3/uL (ref 0.7–4.0)
MCHC: 33.5 g/dL (ref 30.0–36.0)
MCV: 84.3 fl (ref 78.0–100.0)
Monocytes Absolute: 0.4 10*3/uL (ref 0.1–1.0)
Monocytes Relative: 8.7 % (ref 3.0–12.0)
Neutro Abs: 2.7 10*3/uL (ref 1.4–7.7)
Neutrophils Relative %: 55 % (ref 43.0–77.0)
Platelets: 169 10*3/uL (ref 150.0–400.0)
RBC: 4.81 Mil/uL (ref 4.22–5.81)
RDW: 13.7 % (ref 11.5–15.5)
WBC: 4.9 10*3/uL (ref 4.0–10.5)

## 2022-01-29 LAB — PSA: PSA: 0.45 ng/mL (ref 0.10–4.00)

## 2022-01-29 LAB — COMPREHENSIVE METABOLIC PANEL
ALT: 14 U/L (ref 0–53)
AST: 16 U/L (ref 0–37)
Albumin: 4 g/dL (ref 3.5–5.2)
Alkaline Phosphatase: 77 U/L (ref 39–117)
BUN: 13 mg/dL (ref 6–23)
CO2: 26 mEq/L (ref 19–32)
Calcium: 8.7 mg/dL (ref 8.4–10.5)
Chloride: 104 mEq/L (ref 96–112)
Creatinine, Ser: 0.72 mg/dL (ref 0.40–1.50)
GFR: 106.68 mL/min (ref >60.00)
Glucose, Bld: 85 mg/dL (ref 70–99)
Potassium: 4.1 mEq/L (ref 3.5–5.1)
Sodium: 139 mEq/L (ref 135–145)
Total Bilirubin: 0.7 mg/dL (ref 0.2–1.2)
Total Protein: 6.9 g/dL (ref 6.0–8.3)

## 2022-01-29 LAB — HEMOGLOBIN A1C: Hgb A1c MFr Bld: 6 % (ref 4.6–6.5)

## 2022-02-02 ENCOUNTER — Encounter: Payer: Self-pay | Admitting: Family

## 2022-02-03 ENCOUNTER — Ambulatory Visit: Payer: BC Managed Care – PPO

## 2022-02-06 DIAGNOSIS — G4733 Obstructive sleep apnea (adult) (pediatric): Secondary | ICD-10-CM | POA: Diagnosis not present

## 2022-02-22 DIAGNOSIS — M5412 Radiculopathy, cervical region: Secondary | ICD-10-CM | POA: Diagnosis not present

## 2022-02-27 ENCOUNTER — Encounter: Payer: Self-pay | Admitting: Family

## 2022-02-27 ENCOUNTER — Telehealth (INDEPENDENT_AMBULATORY_CARE_PROVIDER_SITE_OTHER): Payer: BC Managed Care – PPO | Admitting: Family

## 2022-02-27 MED ORDER — SEMAGLUTIDE-WEIGHT MANAGEMENT 0.5 MG/0.5ML ~~LOC~~ SOAJ: 0.5000 mg | SUBCUTANEOUS | 1 refills | Status: DC

## 2022-02-27 MED ORDER — SEMAGLUTIDE-WEIGHT MANAGEMENT 0.25 MG/0.5ML ~~LOC~~ SOAJ: 0.2500 mg | SUBCUTANEOUS | 1 refills | Status: AC

## 2022-02-27 NOTE — Progress Notes (Signed)
Virtual Visit via Video Note  I connected with Seth Simmons on 02/27/22 at 10:00 AM EST by a video enabled telemedicine application and verified that I am speaking with the correct person using two identifiers. Location patient: home Location provider: work  Persons participating in the virtual visit: patient, provider  I discussed the limitations of evaluation and management by telemedicine and the availability of in person appointments. The patient expressed understanding and agreed to proceed.  HPI: Metformin has not been helpful for weight loss.   He would like to discuss weight loss medication ,Ozempic  H/o  preDM  He has h/o constipation and will use linzess from Dr Vicente Males periodically.   No  personal or family history of thyroid cancer or endocrine cancer.   ROS: See pertinent positives and negatives per HPI.  EXAM:  VITALS per patient if applicable: Ht '6\' 4"'$  (1.93 m)   Wt (!) 336 lb 6.4 oz (152.6 kg)   BMI 40.95 kg/m  BP Readings from Last 3 Encounters:  01/08/22 128/78  06/27/21 120/79  05/12/21 138/70   Wt Readings from Last 3 Encounters:  02/27/22 (!) 336 lb 6.4 oz (152.6 kg)  01/08/22 (!) 336 lb 6.4 oz (152.6 kg)  06/27/21 (!) 338 lb (153.3 kg)    GENERAL: alert, oriented, appears well and in no acute distress  HEENT: atraumatic, conjunttiva clear, no obvious abnormalities on inspection of external nose and ears  NECK: normal movements of the head and neck  LUNGS: on inspection no signs of respiratory distress, breathing rate appears normal, no obvious gross SOB, gasping or wheezing  CV: no obvious cyanosis  MS: moves all visible extremities without noticeable abnormality  PSYCH/NEURO: pleasant and cooperative, no obvious depression or anxiety, speech and thought processing grossly intact  ASSESSMENT AND PLAN: Morbid obesity (HCC) Assessment & Plan: Trial of wegovy. Counseled on blackbox warning as a relates to medullary thyroid cancer, multiple  endocrine neoplasia.  Counseled on side effects, particularly constipation as patient is a history of constipation and takes Linzess as needed. He understands to let me know if constipation was not to be easily relieved as that would be reason to discontinue medication for safety.  Discussed side effects, administration and mechanism of action.    Orders: -     Semaglutide-Weight Management; Inject 0.25 mg into the skin once a week for 28 days.  Dispense: 2 mL; Refill: 1 -     Semaglutide-Weight Management; Inject 0.5 mg into the skin once a week for 28 days.  Dispense: 2 mL; Refill: 1     -we discussed possible serious and likely etiologies, options for evaluation and workup, limitations of telemedicine visit vs in person visit, treatment, treatment risks and precautions. Pt prefers to treat via telemedicine empirically rather then risking or undertaking an in person visit at this moment.    I discussed the assessment and treatment plan with the patient. The patient was provided an opportunity to ask questions and all were answered. The patient agreed with the plan and demonstrated an understanding of the instructions.   The patient was advised to call back or seek an in-person evaluation if the symptoms worsen or if the condition fails to improve as anticipated.  Advised if desired AVS can be mailed or viewed via [place] if [place] user.   [name], FNP

## 2022-02-27 NOTE — Assessment & Plan Note (Addendum)
Trial of wegovy. Counseled on blackbox warning as a relates to medullary thyroid cancer, multiple endocrine neoplasia.  Counseled on side effects, particularly constipation as patient is a history of constipation and takes Linzess as needed. He understands to let me know if constipation was not to be easily relieved as that would be reason to discontinue medication for safety.  Discussed side effects, administration and mechanism of action.

## 2022-02-27 NOTE — Patient Instructions (Addendum)
We have discussed starting non insulin daily injectable medication called Wegovy  which is a glucagon like peptide (GLP 1) agonist and works by delaying gastric emptying and increasing insulin secretion.It is given once per week. Most patients see significant weight loss with this drug class.   You may NOT take either medication if you or your family has history of thyroid, parathyroid, OR adrenal cancer. Please confirm you and your family does NOT have this history as this drug class has black box warning on this medication for that reason.   Please follow  directions on prescription and slowly increase from 0.25mg [place] once per week ;stay here for 4 weeks. You may then increase to 0.5mg [place] once per week and stay there for 4 weeks.  We can slowly titrate further at follow up with goal of no more than 1-2 lbs weight loss per week.  Semaglutide (Wegovy)  Dose (mg) Once Weekly Titration:    If a dose is not tolerated, consider delaying further dose increases for  another 4 weeks.  If you are actively losing weight on a dose, do not increase medication.   Weeks 1 through 4  0.25 mg once weekly  Weeks 5 through 8  0.5 mg once weekly  Weeks 9 though 12  1 mg once weekly  Weeks 13 through 16  1.7 mg once weekly   Semaglutide Injection (Weight Management) What is this medication? SEMAGLUTIDE (SEM a GLOO tide) promotes weight loss. It may also be used to maintain weight loss. It works by decreasing appetite. Changes to diet and exercise are often combined with this medication. This medicine may be used for other purposes; ask your health care provider or pharmacist if you have questions. COMMON BRAND NAME(S): Wegovy What should I tell my care team before I take this medication? They need to know if you have any of these conditions: Endocrine tumors (MEN 2) or if someone in your family had these tumors Eye disease, vision problems Gallbladder disease History of depression or mental health  disease History of pancreatitis Kidney disease Stomach or intestine problems Suicidal thoughts, plans, or attempt; a previous suicide attempt by you or a family member Thyroid cancer or if someone in your family had thyroid cancer An unusual or allergic reaction to semaglutide, other medications, foods, dyes, or preservatives Pregnant or trying to get pregnant Breast-feeding How should I use this medication? This medication is injected under the skin. You will be taught how to prepare and give it. Take it as directed on the prescription label. It is given once every week (every 7 days). Keep taking it unless your care team tells you to stop. It is important that you put your used needles and pens in a special sharps container. Do not put them in a trash can. If you do not have a sharps container, call your pharmacist or care team to get one. A special MedGuide will be given to you by the pharmacist with each prescription and refill. Be sure to read this information carefully each time. This medication comes with INSTRUCTIONS FOR USE. Ask your pharmacist for directions on how to use this medication. Read the information carefully. Talk to your pharmacist or care team if you have questions. Talk to your care team about the use of this medication in children. While it may be prescribed for children as young as 12 years for selected conditions, precautions do apply. Overdosage: If you think you have taken too much of this medicine contact a   poison control center or emergency room at once. NOTE: This medicine is only for you. Do not share this medicine with others. What if I miss a dose? If you miss a dose and the next scheduled dose is more than 2 days away, take the missed dose as soon as possible. If you miss a dose and the next scheduled dose is less than 2 days away, do not take the missed dose. Take the next dose at your regular time. Do not take double or extra doses. If you miss your dose for 2  weeks or more, take the next dose at your regular time or call your care team to talk about how to restart this medication. What may interact with this medication? Insulin and other medications for diabetes This list may not describe all possible interactions. Give your health care provider a list of all the medicines, herbs, non-prescription drugs, or dietary supplements you use. Also tell them if you smoke, drink alcohol, or use illegal drugs. Some items may interact with your medicine. What should I watch for while using this medication? Visit your care team for regular checks on your progress. It may be some time before you see the benefit from this medication. Drink plenty of fluids while taking this medication. Check with your care team if you have severe diarrhea, nausea, and vomiting, or if you sweat a lot. The loss of too much body fluid may make it dangerous for you to take this medication. This medication may affect blood sugar levels. Ask your care team if changes in diet or medications are needed if you have diabetes. If you or your family notice any changes in your behavior, such as new or worsening depression, thoughts of harming yourself, anxiety, other unusual or disturbing thoughts, or memory loss, call your care team right away. Women should inform their care team if they wish to become pregnant or think they might be pregnant. Losing weight while pregnant is not advised and may cause harm to the unborn child. Talk to your care team for more information. What side effects may I notice from receiving this medication? Side effects that you should report to your care team as soon as possible: Allergic reactions--skin rash, itching, hives, swelling of the face, lips, tongue, or throat Change in vision Dehydration--increased thirst, dry mouth, feeling faint or lightheaded, headache, dark yellow or brown urine Gallbladder problems--severe stomach pain, nausea, vomiting, fever Heart  palpitations--rapid, pounding, or irregular heartbeat Kidney injury--decrease in the amount of urine, swelling of the ankles, hands, or feet Pancreatitis--severe stomach pain that spreads to your back or gets worse after eating or when touched, fever, nausea, vomiting Thoughts of suicide or self-harm, worsening mood, feelings of depression Thyroid cancer--new mass or lump in the neck, pain or trouble swallowing, trouble breathing, hoarseness Side effects that usually do not require medical attention (report to your care team if they continue or are bothersome): Diarrhea Loss of appetite Nausea Stomach pain Vomiting This list may not describe all possible side effects. Call your doctor for medical advice about side effects. You may report side effects to FDA at 1-800-FDA-1088. Where should I keep my medication? Keep out of the reach of children and pets. Refrigeration (preferred): Store in the refrigerator. Do not freeze. Keep this medication in the original container until you are ready to take it. Get rid of any unused medication after the expiration date. Room temperature: If needed, prior to cap removal, the pen can be stored at room temperature   for up to 28 days. Protect from light. If it is stored at room temperature, get rid of any unused medication after 28 days or after it expires, whichever is first. It is important to get rid of the medication as soon as you no longer need it or it is expired. You can do this in two ways: Take the medication to a medication take-back program. Check with your pharmacy or law enforcement to find a location. If you cannot return the medication, follow the directions in the MedGuide. NOTE: This sheet is a summary. It may not cover all possible information. If you have questions about this medicine, talk to your doctor, pharmacist, or health care provider.  2023 Elsevier/Gold Standard (2020-04-28 00:00:00)  

## 2022-03-04 ENCOUNTER — Encounter: Payer: Self-pay | Admitting: Family

## 2022-03-09 ENCOUNTER — Ambulatory Visit (INDEPENDENT_AMBULATORY_CARE_PROVIDER_SITE_OTHER): Payer: BC Managed Care – PPO

## 2022-03-09 ENCOUNTER — Ambulatory Visit: Payer: BC Managed Care – PPO | Admitting: Family

## 2022-03-09 ENCOUNTER — Encounter: Payer: Self-pay | Admitting: Family

## 2022-03-09 VITALS — BP 118/78 | HR 86 | Temp 98.0°F | Ht 74.0 in | Wt 337.2 lb

## 2022-03-09 DIAGNOSIS — M19041 Primary osteoarthritis, right hand: Secondary | ICD-10-CM | POA: Diagnosis not present

## 2022-03-09 DIAGNOSIS — M79641 Pain in right hand: Secondary | ICD-10-CM

## 2022-03-09 LAB — B12 AND FOLATE PANEL
Folate: 22.4 ng/mL (ref >5.9)
Vitamin B-12: 459 pg/mL (ref 211–911)

## 2022-03-09 MED ORDER — TRAMADOL HCL 50 MG PO TABS: 50.0000 mg | ORAL_TABLET | Freq: Three times a day (TID) | ORAL | 1 refills | Status: DC | PRN

## 2022-03-09 MED ORDER — GABAPENTIN 100 MG PO CAPS: 100.0000 mg | ORAL_CAPSULE | Freq: Three times a day (TID) | ORAL | 3 refills | Status: DC

## 2022-03-09 NOTE — Patient Instructions (Signed)
Trial of tramadol 50 mg every 8 hours as needed for pain.  This is an opioid like medication. Tiral of  gabapentin which is more helpful for pain, numbness.  To start gabapentin, take 100 mg morning ,lunch and in the evening.  You may escalate this dose by taking 100 mg in the morning, 100 mg midday and 200 mg in the evening if needed after couple days.   may further escalate evening dose to 300 mg at bedtime if needed.  Urgent referral placed Dr. Peggye Ley at [facility].  Please let me know how  you are doing.

## 2022-03-09 NOTE — Progress Notes (Signed)
Assessment & Plan:  Right hand pain Assessment & Plan: C/w CTS.  Presentation consistent initially which appeared to be cervical stenosis although presentation today appears more to be a level of the wrist distrubution of medial nerve.  Requesting cervical XR from Urgent care. Unfortunately, no relief with prednisone taper.  We opted to start gabapentin and titrate.  Provided tramadol for pain relief as NSAIDs have not been effective.  Counseled on risks of tramadol, common side effects of both tramadol and gabapentin being sedation.  Advised no alcohol use with either medication. urgent referral Dr. Peggye Ley the patient will likely benefit corticosteroid injection, EMG study.  Pending B12, plain film x-ray.  Orders: -     Ambulatory referral to Orthopedic Surgery -     DG Hand Complete Right -     B12 and Folate Panel -     Gabapentin; Take 1 capsule (100 mg total) by mouth 3 (three) times daily.  Dispense: 90 capsule; Refill: 3 -     traMADol HCl; Take 1 tablet (50 mg total) by mouth every 8 (eight) hours as needed.  Dispense: 30 tablet; Refill: 1     Return precautions given.   Risks, benefits, and alternatives of the medications and treatment plan prescribed today were discussed, and patient expressed understanding.   Education regarding symptom management and diagnosis given to patient on AVS either electronically or printed.  No follow-ups on file.  [name], FNP  Subjective:    Patient ID: Corbet Hanley, male    DOB: [birth year], 52 y.o.   MRN: [id number]  CC: Blade Scheff is a 52 y.o. male who presents today for follow up.   HPI: Complains of weakness, numbness right shoulder x 3 weeks, worsening.   Pain started at right posterior shoulder and now moved to 1 to 3rd fingers.  Feels weakness when in arm is flexed.  Throbbing pain over right forearm. Intermittent sharp pain in 1st and 2nd fingers.  No injury.  He takes nap between jobs sitting in truck.  No relief  with prednisone or flexeril.  He is taking aleve, motrin, tylenol without relief.     Works in patient transport and waxing/ striping floors.   No low back pain at this time. No history of seizure.  Occasional alcohol use on the weekends  MRI lumbar spine ordered 12/2021 and it was not scheduled.    Seen at urgent care 02/22/22 Diagnosed with cervical adenopathy.  Prednisone, cervical x-ray obtained (unable to see) Advised Tylenol.  Cyclobenzaprine for muscle spasm. Prednisone taper '60mg'$  x 6 days   Allergies: Patient has no known allergies. Current Outpatient Medications on File Prior to Visit  Medication Sig Dispense Refill   albuterol (VENTOLIN HFA) 108 (90 Base) MCG/ACT inhaler Inhale 2 puffs into the lungs every 6 (six) hours as needed.     linaclotide (LINZESS) 145 MCG CAPS capsule Take 1 capsule (145 mcg total) by mouth daily. 90 capsule 3   naproxen sodium (ALEVE) 220 MG tablet Take 220 mg by mouth.     Semaglutide-Weight Management 0.25 MG/0.5ML SOAJ Inject 0.25 mg into the skin once a week for 28 days. 2 mL 1   [START ON 03/28/2022] Semaglutide-Weight Management 0.5 MG/0.5ML SOAJ Inject 0.5 mg into the skin once a week for 28 days. 2 mL 1   No current facility-administered medications on file prior to visit.    Review of Systems  Constitutional:  Negative for chills and fever.  Eyes:  Negative for visual disturbance.  Respiratory:  Negative for cough.   Cardiovascular:  Negative for chest pain and palpitations.  Gastrointestinal:  Negative for nausea and vomiting.  Musculoskeletal:  Positive for arthralgias and back pain.  Neurological:  Positive for numbness. Negative for headaches.      Objective:    BP 118/78   Pulse 86   Temp 98 F (36.7 C) (Oral)   Ht '6\' 2"'$  (1.88 m)   Wt (!) 337 lb 3.2 oz (153 kg)   SpO2 96%   BMI 43.29 kg/m  BP Readings from Last 3 Encounters:  03/09/22 118/78  01/08/22 128/78  06/27/21 120/79   Wt Readings from Last 3 Encounters:   03/09/22 (!) 337 lb 3.2 oz (153 kg)  02/27/22 (!) 336 lb 6.4 oz (152.6 kg)  01/08/22 (!) 336 lb 6.4 oz (152.6 kg)    Physical Exam Vitals reviewed.  Constitutional:      Appearance: He is well-developed.  Neck:      Comments: Right trapezius spasm. No rash.   Right Shoulder:   No asymmetry of shoulders when comparing right and left.No pain with palpation over glenohumeral joint lines, [place] joint, AC joint, or bicipital groove. No pain with internal and external rotation. No pain with resisted lateral extension .   Negative active painful arc sign. Negative passive arc ( Neer's). Negative drop arm.   No pain, swelling, or ecchymosis noted over long head of biceps.    Strength and sensation normal BUE's.  Positive phalens maneuver.   Cardiovascular:     Rate and Rhythm: Regular rhythm.     Heart sounds: Normal heart sounds.  Pulmonary:     Effort: Pulmonary effort is normal. No respiratory distress.     Breath sounds: Normal breath sounds. No wheezing, rhonchi or rales.  Musculoskeletal:     Cervical back: No edema or erythema. Pain with movement and muscular tenderness present. Normal range of motion.  Skin:    General: Skin is warm and dry.  Neurological:     Mental Status: He is alert.  Psychiatric:        Speech: Speech normal.        Behavior: Behavior normal.

## 2022-03-09 NOTE — Assessment & Plan Note (Addendum)
C/w CTS.  Presentation consistent initially which appeared to be cervical stenosis although presentation today appears more to be a level of the wrist distrubution of medial nerve.  Requesting cervical XR from Urgent care. Unfortunately, no relief with prednisone taper.  We opted to start gabapentin and titrate.  Provided tramadol for pain relief as NSAIDs have not been effective.  Counseled on risks of tramadol, common side effects of both tramadol and gabapentin being sedation.  Advised no alcohol use with either medication. urgent referral Dr. Peggye Ley the patient will likely benefit corticosteroid injection, EMG study.  Pending B12, plain film x-ray.

## 2022-03-12 ENCOUNTER — Encounter: Payer: Self-pay | Admitting: Family

## 2022-03-14 ENCOUNTER — Ambulatory Visit: Payer: BC Managed Care – PPO | Admitting: Family

## 2022-03-15 ENCOUNTER — Other Ambulatory Visit (HOSPITAL_COMMUNITY): Payer: Self-pay

## 2022-03-15 ENCOUNTER — Telehealth: Payer: Self-pay

## 2022-03-15 NOTE — Telephone Encounter (Signed)
Pharmacy Patient Advocate Encounter   Received notification from [facility] that prior authorization for Wegovy 0.'25mg'$ /0.81m and Wegovy 0.'5mg'$ /0.5120mis required/requested.  Per Test Claim: Prior auth required   PA submitted on 03/15/22 to (ins) [facility] via [id number] - WeMancel Parsons.'25mg'$ /0.20m67mey [id number] - WegOGACGB'5mg'$ /0.20ml41mtatus is pending

## 2022-03-15 NOTE — Telephone Encounter (Signed)
Spoke to pt and informed him that the PA was started for the [employer] on 03/15/22 and I would contact him as soon as I heard something back from them

## 2022-03-26 ENCOUNTER — Other Ambulatory Visit (HOSPITAL_COMMUNITY): Payer: Self-pay

## 2022-03-26 NOTE — Telephone Encounter (Signed)
Patient Advocate Encounter  Prior Authorization for Mancel Parsons  has been approved.    PA# [id number] Effective dates: 03/15/22 through 07/18/22   Please note: This medication is to be titrated up in strength every 4 weeks as tolerated by the member. The quantity limit set of 4 pens per 180 days allows for this titration through all strengths using 4 pens of each strength every 28 days.   Placed a call to the pharmacy to notify of the approval.

## 2022-03-26 NOTE — Telephone Encounter (Signed)
Spoke to pt and informed him that his PA for Mancel Parsons has been approved. Pt verbalized understanding.

## 2022-03-27 ENCOUNTER — Encounter: Payer: Self-pay | Admitting: Family

## 2022-03-28 ENCOUNTER — Other Ambulatory Visit: Payer: Self-pay | Admitting: Family

## 2022-03-28 ENCOUNTER — Encounter: Payer: Self-pay | Admitting: Family

## 2022-03-28 MED ORDER — SAXENDA 18 MG/3ML ~~LOC~~ SOPN: 0.6000 mg | PEN_INJECTOR | Freq: Every day | SUBCUTANEOUS | 3 refills | Status: DC

## 2022-04-03 DIAGNOSIS — M5412 Radiculopathy, cervical region: Secondary | ICD-10-CM | POA: Diagnosis not present

## 2022-04-04 DIAGNOSIS — M5412 Radiculopathy, cervical region: Secondary | ICD-10-CM | POA: Diagnosis not present

## 2022-04-05 DIAGNOSIS — M5412 Radiculopathy, cervical region: Secondary | ICD-10-CM | POA: Diagnosis not present

## 2022-04-09 ENCOUNTER — Other Ambulatory Visit (HOSPITAL_COMMUNITY): Payer: Self-pay

## 2022-04-10 ENCOUNTER — Ambulatory Visit: Payer: BC Managed Care – PPO | Admitting: Family

## 2022-04-13 ENCOUNTER — Telehealth: Payer: Self-pay | Admitting: Family

## 2022-04-13 DIAGNOSIS — M5412 Radiculopathy, cervical region: Secondary | ICD-10-CM | POA: Diagnosis not present

## 2022-04-13 NOTE — Telephone Encounter (Signed)
Placed in providers folder to be completed

## 2022-04-13 NOTE — Telephone Encounter (Signed)
Pt came into the office to drop off FMLA paperwork. placed in provider folder

## 2022-04-26 ENCOUNTER — Telehealth: Payer: Self-pay

## 2022-04-26 ENCOUNTER — Other Ambulatory Visit (HOSPITAL_COMMUNITY): Payer: Self-pay

## 2022-04-26 NOTE — Telephone Encounter (Signed)
Pharmacy Patient Advocate Encounter   Received notification that prior authorization for Saxenda '18mg'$ /48m is required/requested.  Per Test Claim: prior authorization required   PA submitted on 04/26/22 to (ins) [employer] [place] [employer] # [id number] Status is pending

## 2022-04-27 DIAGNOSIS — M5412 Radiculopathy, cervical region: Secondary | ICD-10-CM | POA: Diagnosis not present

## 2022-04-30 ENCOUNTER — Other Ambulatory Visit (HOSPITAL_COMMUNITY): Payer: Self-pay

## 2022-04-30 ENCOUNTER — Encounter: Payer: Self-pay | Admitting: Family

## 2022-04-30 NOTE — Telephone Encounter (Signed)
Pt advised.

## 2022-04-30 NOTE — Telephone Encounter (Signed)
Patient Advocate Encounter  Prior Authorization for Saxenda '18mg'$ /105m has been approved.    PA# [id number] dates: 04/26/2022 through 08/30/2022  Placed a call to the pharmacy to notify of the approval. Stated they received a paid claim but will have to order the medication for the next day.   Approval letter attached to chart.

## 2022-05-09 ENCOUNTER — Other Ambulatory Visit (HOSPITAL_COMMUNITY): Payer: Self-pay

## 2022-05-09 ENCOUNTER — Encounter: Payer: Self-pay | Admitting: Family

## 2022-05-09 NOTE — Telephone Encounter (Signed)
Spoke to pt  and explained to him about the [facility] and the Saxenda. Pt stated that he would prefer the [facility] over the [place] because the Saxenda is a daily shot verses the [place] being a weekly shot. He wants to know if you can prescribed the next dosage of Wegovy  because it has been deleted from his chart per the PA Team,so we can get PA started if needed.

## 2022-05-09 NOTE — Telephone Encounter (Signed)
Pt called in regarding previous message. As per pt, [facility] told him that med [facility] did not get approve. As per pt, [facility] had approve this for him. Any questions, or concern pt available '@[phone]'$ [phone]

## 2022-05-11 ENCOUNTER — Telehealth: Payer: Self-pay

## 2022-05-11 ENCOUNTER — Other Ambulatory Visit: Payer: Self-pay | Admitting: Family

## 2022-05-11 ENCOUNTER — Other Ambulatory Visit (HOSPITAL_COMMUNITY): Payer: Self-pay

## 2022-05-11 DIAGNOSIS — M5412 Radiculopathy, cervical region: Secondary | ICD-10-CM | POA: Diagnosis not present

## 2022-05-11 MED ORDER — WEGOVY 0.5 MG/0.5ML ~~LOC~~ SOAJ: 0.5000 mg | SUBCUTANEOUS | 3 refills | Status: DC

## 2022-05-11 MED ORDER — WEGOVY 0.25 MG/0.5ML ~~LOC~~ SOAJ: 0.2500 mg | SUBCUTANEOUS | 2 refills | Status: DC

## 2022-05-11 NOTE — Telephone Encounter (Signed)
Spoke to pt and informed him that PA for [facility] was sent in

## 2022-05-11 NOTE — Telephone Encounter (Signed)
Pharmacy Patient Advocate Encounter   Received notification that prior authorization for [facility] 0.25MG /0.5ML auto-injectors is required/requested.  Per Test Claim: PA required   PA submitted on 05/11/22 to (ins) [place] [employer] via [employer] or [facility]) confirmation # [id number] Status is pending

## 2022-05-14 ENCOUNTER — Other Ambulatory Visit (HOSPITAL_COMMUNITY): Payer: Self-pay

## 2022-05-14 ENCOUNTER — Encounter: Payer: Self-pay | Admitting: Family

## 2022-05-14 NOTE — Telephone Encounter (Signed)
Spoke to [place] and he stated that pt insurance did approve 1 box Wegovy  for 180 days. Called pt and informed him also.

## 2022-05-14 NOTE — Telephone Encounter (Signed)
Pharmacy Patient Advocate Encounter  Received notification from [facility] that the request for prior authorization for Wegovy 0.25MG /0.5ML auto-injectors  has been CANCELLED due to this medication was previously reviewed and approved.

## 2022-05-15 ENCOUNTER — Other Ambulatory Visit (HOSPITAL_COMMUNITY): Payer: Self-pay

## 2022-05-15 NOTE — Telephone Encounter (Signed)
Spoke to [employer] @ [facility] and e stated that the insurance was saying that the 1 box that they approved had already been filled, but he could not see where the pt had picked it up. Elta Guadeloupe stated that he would call insurance and see if we could figure out what is happening and he would reach back out to me. Pt is aware as well.

## 2022-05-16 ENCOUNTER — Other Ambulatory Visit (HOSPITAL_COMMUNITY): Payer: Self-pay

## 2022-05-29 ENCOUNTER — Other Ambulatory Visit (HOSPITAL_COMMUNITY): Payer: Self-pay

## 2022-05-29 ENCOUNTER — Encounter: Payer: Self-pay | Admitting: Family

## 2022-06-05 ENCOUNTER — Other Ambulatory Visit (HOSPITAL_COMMUNITY): Payer: Self-pay

## 2022-06-05 NOTE — Telephone Encounter (Signed)
Thank you Ashleigh!!!!!!!

## 2022-06-28 ENCOUNTER — Encounter: Payer: Self-pay | Admitting: Family

## 2022-06-29 ENCOUNTER — Other Ambulatory Visit: Payer: Self-pay | Admitting: Family

## 2022-06-29 MED ORDER — WEGOVY 0.5 MG/0.5ML ~~LOC~~ SOAJ: 0.5000 mg | SUBCUTANEOUS | 1 refills | Status: DC

## 2022-07-04 ENCOUNTER — Other Ambulatory Visit: Payer: Self-pay | Admitting: Family

## 2022-07-04 DIAGNOSIS — M545 Low back pain, unspecified: Secondary | ICD-10-CM

## 2022-07-05 ENCOUNTER — Other Ambulatory Visit: Payer: Self-pay

## 2022-07-05 ENCOUNTER — Encounter: Payer: Self-pay | Admitting: Family

## 2022-07-05 MED ORDER — WEGOVY 1 MG/0.5ML ~~LOC~~ SOAJ: 1.0000 mg | SUBCUTANEOUS | 3 refills | Status: DC

## 2022-07-05 NOTE — Telephone Encounter (Signed)
Pt need an increase in wegovy sent to [facility]

## 2022-07-05 NOTE — Telephone Encounter (Signed)
Wegovy 1 mg sent in to pharmacy pt is aware and scheduled f/up for 08/13/22 @1 :30pm

## 2022-07-31 ENCOUNTER — Encounter: Payer: Self-pay | Admitting: Family

## 2022-07-31 ENCOUNTER — Other Ambulatory Visit: Payer: Self-pay

## 2022-07-31 ENCOUNTER — Other Ambulatory Visit: Payer: BC Managed Care – PPO

## 2022-07-31 DIAGNOSIS — R7303 Prediabetes: Secondary | ICD-10-CM

## 2022-07-31 NOTE — Telephone Encounter (Signed)
Spoke to pt and scheduled lab appt for microalbumin for 07/31/22

## 2022-07-31 NOTE — Addendum Note (Signed)
Addended by: [place], Porter Moes on: 07/31/2022 08:58 AM   Modules accepted: Orders

## 2022-08-01 ENCOUNTER — Encounter: Payer: Self-pay | Admitting: Family

## 2022-08-01 LAB — MICROALBUMIN / CREATININE URINE RATIO
Creatinine,U: 257.7 mg/dL
Microalb Creat Ratio: 0.9 mg/g (ref 0.0–30.0)
Microalb, Ur: 2.4 mg/dL — ABNORMAL HIGH (ref 0.0–1.9)

## 2022-08-02 ENCOUNTER — Other Ambulatory Visit (HOSPITAL_COMMUNITY): Payer: Self-pay

## 2022-08-02 ENCOUNTER — Telehealth: Payer: Self-pay

## 2022-08-02 NOTE — Telephone Encounter (Signed)
Noted! Thank you

## 2022-08-02 NOTE — Telephone Encounter (Signed)
Pharmacy Patient Advocate Encounter   Received notification that prior authorization for Wegovy 1.7MG /0.75ML auto-injectors is required/requested.   PA initiated to [facility] via CoverMyMeds Key or (Medicaid) confirmation # [id number]  Waiting on questions to populate.

## 2022-08-03 NOTE — Telephone Encounter (Signed)
Started PA for [employer]. Current weight and date is needed. Please advise.

## 2022-08-03 NOTE — Telephone Encounter (Signed)
Please disregard, after further investigation, patient has not taken medication over 4 months. Insurance requires current weight if patient has taken medication over 4 months.   PA submitted via CMM.

## 2022-08-06 ENCOUNTER — Other Ambulatory Visit (HOSPITAL_COMMUNITY): Payer: Self-pay

## 2022-08-06 NOTE — Telephone Encounter (Signed)
Patient Advocate Encounter  Prior Authorization for [employer] 1.7MG /0.75ML auto-injectors has been approved with [employer].    Effective dates: 08/03/22 through 12/07/22  Per [facility] test claim, copay for 28 days supply is $24.99 (with eV[employer]cher)

## 2022-08-06 NOTE — Telephone Encounter (Signed)
Spoke to pt and sent message via my chart as well

## 2022-08-08 ENCOUNTER — Encounter: Payer: Self-pay | Admitting: Family

## 2022-08-13 ENCOUNTER — Ambulatory Visit: Payer: BC Managed Care – PPO | Admitting: Family

## 2022-08-13 ENCOUNTER — Encounter: Payer: Self-pay | Admitting: Family

## 2022-08-13 ENCOUNTER — Other Ambulatory Visit: Payer: Self-pay

## 2022-08-13 VITALS — BP 130/76 | HR 86 | Temp 98.1°F | Ht 76.0 in | Wt 316.2 lb

## 2022-08-13 DIAGNOSIS — R7309 Other abnormal glucose: Secondary | ICD-10-CM

## 2022-08-13 DIAGNOSIS — M25561 Pain in right knee: Secondary | ICD-10-CM

## 2022-08-13 DIAGNOSIS — R899 Unspecified abnormal finding in specimens from other organs, systems and tissues: Secondary | ICD-10-CM

## 2022-08-13 DIAGNOSIS — R809 Proteinuria, unspecified: Secondary | ICD-10-CM

## 2022-08-13 LAB — POCT GLYCOSYLATED HEMOGLOBIN (HGB A1C): Hemoglobin A1C: 5.3 % (ref 4.0–5.6)

## 2022-08-13 MED ORDER — WEGOVY 1.7 MG/0.75ML ~~LOC~~ SOAJ: 1.7000 mg | SUBCUTANEOUS | 1 refills | Status: DC

## 2022-08-13 MED ORDER — LINACLOTIDE 145 MCG PO CAPS: 145.0000 ug | ORAL_CAPSULE | Freq: Every day | ORAL | 1 refills | Status: DC

## 2022-08-13 NOTE — Patient Instructions (Signed)
Ensure that you are icing your right knee daily.  Use Ace wrap during the day Please be more liberal with naproxen sodium (Aleve). Please bring urine back in 2 months time to recheck protein  Exercises for Chronic Knee Pain Chronic knee pain is pain that lasts longer than 3 months. For most people with chronic knee pain, exercise and weight loss is an important part of treatment. Your health care provider may want you to focus on: Making the muscles that support your knee stronger. This can take pressure off your knee and reduce pain. Preventing knee stiffness. How far you can move your knee, keeping it there or making it farther. Losing weight (if this applies) to take pressure off your knee, lower your risk for injury, and make it easier for you to exercise. Your provider will help you make an exercise program that fits your needs and physical abilities. Below are simple, low-impact exercises you can do at home. Ask your provider or physical therapist how often you should do your exercise program and how many times to repeat each exercise. General safety tips  Get your provider's approval before doing any exercises. Start slowly and stop any time you feel pain. Do not exercise if your knee pain is flaring up. Warm up first. Stretching a cold muscle can cause an injury. Do 5-10 minutes of easy movement or light stretching before beginning your exercises. Do 5-10 minutes of low-impact activity (like walking or cycling) before starting strengthening exercises. Contact your provider any time you have pain during or after exercising. Exercise can cause discomfort but should not be painful. It is normal to be a little stiff or sore after exercising. Stretching and range-of-motion exercises Front thigh stretch  Stand up straight and support your body by holding on to a chair or resting one hand on a wall. With your legs straight and close together, bend one knee to lift your heel up toward your  butt. Using one hand for support, grab your ankle with your free hand. Pull your foot up closer toward your butt to feel the stretch in front of your thigh. Hold the stretch for 30 seconds. Repeat __________ times. Complete this exercise __________ times a day. Back thigh stretch  Sit on the floor with your back straight and your legs out straight in front of you. Place the palms of your hands on the floor and slide them toward your feet as you bend at the hip. Try to touch your nose to your knees and feel the stretch in the back of your thighs. Hold for 30 seconds. Repeat __________ times. Complete this exercise __________ times a day. Calf stretch  Stand facing a wall. Place the palms of your hands flat against the wall, arms extended, and lean slightly against the wall. Get into a lunge position with one leg bent at the knee and the other leg stretched out straight behind you. Keep both feet facing the wall and increase the bend in your knee while keeping the heel of the other leg flat on the ground. You should feel the stretch in your calf. Hold for 30 seconds. Repeat __________ times. Complete this exercise __________ times a day. Strengthening exercises Straight leg lift  Lie on your back with one knee bent and the other leg out straight. Slowly lift the straight leg without bending the knee. Lift until your foot is about 12 inches (30 cm) off the floor. Hold for 3-5 seconds and slowly lower your leg. Repeat __________  times. Complete this exercise __________ times a day. Single leg dip  Stand between two chairs and put both hands on the backs of the chairs for support. Extend one leg out straight with your body weight resting on the heel of the standing leg. Slowly bend your standing knee to dip your body to the level that is comfortable for you. Hold for 3-5 seconds. Repeat __________ times. Complete this exercise __________ times a day. Hamstring curls  Stand straight,  knees close together, facing the back of a chair. Hold on to the back of a chair with both hands. Keep one leg straight. Bend the other knee while bringing the heel up toward the butt until the knee is bent at a 90-degree angle (right angle). Hold for 3-5 seconds. Repeat __________ times. Complete this exercise __________ times a day. Wall squat  Stand straight with your back, hips, and head against a wall. Step forward one foot at a time with your back still against the wall. Your feet should be 2 feet (61 cm) from the wall at shoulder width. Keeping your back, hips, and head against the wall, slide down the wall to as close to a sitting position as you can get. Hold for 5-10 seconds, then slowly slide back up. Repeat __________ times. Complete this exercise __________ times a day. Step-ups  Stand in front of a sturdy platform or stool that is about 6 inches (15 cm) high. Slowly step up with your left / right foot, keeping your knee in line with your hip and foot. Do not let your knee bend so far that you cannot see your toes. Hold on to a chair for balance, but do not use it for support. Slowly unlock your knee and lower yourself to the starting position. Repeat __________ times. Complete this exercise __________ times a day. Contact a health care provider if: Your exercises cause pain. Your pain is worse after you exercise. Your pain prevents you from doing your exercises. This information is not intended to replace advice given to you by your health care provider. Make sure you discuss any questions you have with your health care provider. Document Revised: 02/27/2022 Document Reviewed: 02/27/2022 Elsevier Patient Education  2024 [employer].

## 2022-08-13 NOTE — Assessment & Plan Note (Signed)
Congratulated patient on weight loss.  Continue Wegovy 1.7mg 

## 2022-08-13 NOTE — Assessment & Plan Note (Signed)
Discussed meniscal etiology and/or Baker's cyst.  Patient politely declines imaging at this time.  Discussed conservative management with icing regimen, Ace wrap, use of naproxen sodium.  Exercises included on his after visit summary.  Patient will let me know if pain does not improve

## 2022-08-13 NOTE — Assessment & Plan Note (Signed)
Unsure if transient.  Repeat in 2 months time.  Discussed ACE/ARB.  In the setting of aggressive intentional weight loss, we deferred starting ACE/ARB as blood pressure may lower.

## 2022-08-13 NOTE — Progress Notes (Signed)
Assessment & Plan:  Elevated glucose -     POCT glycosylated hemoglobin (Hb A1C)  Abnormal laboratory test -     Microalbumin / creatinine urine ratio; Future  Microalbuminuria Assessment & Plan: Unsure if transient.  Repeat in 2 months time.  Discussed ACE/ARB.  In the setting of aggressive intentional weight loss, we deferred starting ACE/ARB as blood pressure may lower.    Morbid obesity (HCC) Assessment & Plan: Congratulated patient on weight loss.  Continue Wegovy 1.7mg     Acute pain of right knee Assessment & Plan: Discussed meniscal etiology and/or Baker's cyst.  Patient politely declines imaging at this time.  Discussed conservative management with icing regimen, Ace wrap, use of naproxen sodium.  Exercises included on his after visit summary.  Patient will let me know if pain does not improve   Other orders -     Wegovy; Inject 1.7 mg into the skin once a week.  Dispense: 3 mL; Refill: 1     Return precautions given.   Risks, benefits, and alternatives of the medications and treatment plan prescribed today were discussed, and patient expressed understanding.   Education regarding symptom management and diagnosis given to patient on AVS either electronically or printed.  Return in about 6 months (around 02/12/2023).  Rennie Plowman, FNP  Subjective:    Patient ID: Seth Simmons, male    DOB: [birth year], 52 y.o.   MRN: [id number]  CC: Seth Simmons is a 52 y.o. male who presents today for follow up.   HPI: Complains of right posterior knee pain x 3 days.  Pain will get better for several months and then worse.  Knee has given way in the past, not recently.   He is not taking gabapentin.   He took aleve once.   He is not acing.  In the past he had worn a brace  Compliant with Wegovy 1.7.  He has lost approximately 30 pounds and very pleased with weight loss.  Constipation is very rare.  He will take Linzess a couple times a month with relief.  EMG  positive C7 radiculopathy on the right side.  No relief with corticosteroid injection.  Discussed surgery with Dr. Noralyn Pick.   Allergies: Patient has no known allergies. Current Outpatient Medications on File Prior to Visit  Medication Sig Dispense Refill   gabapentin (NEURONTIN) 100 MG capsule Take 1 capsule (100 mg total) by mouth 3 (three) times daily. 90 capsule 3   naproxen sodium (ALEVE) 220 MG tablet Take 220 mg by mouth.     traMADol (ULTRAM) 50 MG tablet Take 1 tablet (50 mg total) by mouth every 8 (eight) hours as needed. 30 tablet 1   No current facility-administered medications on file prior to visit.    Review of Systems  Constitutional:  Negative for chills and fever.  Respiratory:  Negative for cough.   Cardiovascular:  Negative for chest pain and palpitations.  Gastrointestinal:  Negative for nausea and vomiting.      Objective:    BP 130/76   Pulse 86   Temp 98.1 F (36.7 C) (Oral)   Ht 6\' 4"  (1.93 m)   Wt (!) 316 lb 3.2 oz (143.4 kg)   SpO2 95%   BMI 38.49 kg/m  BP Readings from Last 3 Encounters:  08/13/22 130/76  03/09/22 118/78  01/08/22 128/78   Wt Readings from Last 3 Encounters:  08/13/22 (!) 316 lb 3.2 oz (143.4 kg)  03/09/22 (!) 337 lb 3.2 oz (153 kg)  02/27/22 (!) 336 lb 6.4 oz (152.6 kg)    Physical Exam Vitals reviewed.  Constitutional:      Appearance: He is well-developed.  Cardiovascular:     Rate and Rhythm: Regular rhythm.     Heart sounds: Normal heart sounds.  Pulmonary:     Effort: Pulmonary effort is normal. No respiratory distress.     Breath sounds: Normal breath sounds. No wheezing, rhonchi or rales.  Skin:    General: Skin is warm and dry.  Neurological:     Mental Status: He is alert.  Psychiatric:        Speech: Speech normal.        Behavior: Behavior normal.

## 2022-08-13 NOTE — Telephone Encounter (Signed)
Done

## 2022-08-27 ENCOUNTER — Other Ambulatory Visit (HOSPITAL_COMMUNITY): Payer: Self-pay

## 2022-09-01 ENCOUNTER — Other Ambulatory Visit: Payer: Self-pay | Admitting: Family

## 2022-09-04 ENCOUNTER — Encounter: Payer: Self-pay | Admitting: Family

## 2022-09-24 ENCOUNTER — Encounter: Payer: Self-pay | Admitting: Family

## 2022-09-25 ENCOUNTER — Telehealth: Payer: Self-pay | Admitting: Family

## 2022-09-25 NOTE — Telephone Encounter (Signed)
Patient dropped off document FMLA, to be filled out by provider. Patient requested to send it via Mail within 7-days. Document is located in providers folder at front office.Please advise at Mobile [phone] (mobile).

## 2022-09-27 NOTE — Telephone Encounter (Signed)
Picked up from upfront and placed in folder today!

## 2022-10-04 ENCOUNTER — Encounter: Payer: Self-pay | Admitting: Family

## 2022-10-04 ENCOUNTER — Other Ambulatory Visit: Payer: Self-pay

## 2022-10-04 MED ORDER — WEGOVY 2.4 MG/0.75ML ~~LOC~~ SOAJ: SUBCUTANEOUS | 2 refills | Status: DC

## 2022-10-05 NOTE — Telephone Encounter (Signed)
Spoke to pt and he stated that the FMLA was from when he had the numbness and tingling in his hand he had to get MRI. Pt stated that he would see if he could get old paperwork so we could go by that because it is not in his chart.

## 2022-10-08 ENCOUNTER — Encounter: Payer: Self-pay | Admitting: Family

## 2022-10-08 ENCOUNTER — Other Ambulatory Visit (INDEPENDENT_AMBULATORY_CARE_PROVIDER_SITE_OTHER): Payer: BC Managed Care – PPO

## 2022-10-08 DIAGNOSIS — R899 Unspecified abnormal finding in specimens from other organs, systems and tissues: Secondary | ICD-10-CM | POA: Diagnosis not present

## 2022-10-08 LAB — MICROALBUMIN / CREATININE URINE RATIO
Creatinine,U: 283.7 mg/dL
Microalb Creat Ratio: 1 mg/g (ref 0.0–30.0)
Microalb, Ur: 2.9 mg/dL — ABNORMAL HIGH (ref 0.0–1.9)

## 2022-10-09 ENCOUNTER — Encounter: Payer: Self-pay | Admitting: Family

## 2022-10-10 ENCOUNTER — Other Ambulatory Visit: Payer: Self-pay

## 2022-10-10 DIAGNOSIS — R7303 Prediabetes: Secondary | ICD-10-CM

## 2022-10-10 MED ORDER — LOSARTAN POTASSIUM-HCTZ 50-12.5 MG PO TABS: 1.0000 | ORAL_TABLET | Freq: Every day | ORAL | 3 refills | Status: DC

## 2022-10-10 NOTE — Telephone Encounter (Signed)
Spoke to pt and informed him that we sent in  losartan 12.5 mg  and sent in 90-day supply with refill.  ordered and  BMP lab for 1 week after starting.  Pt will call back and schedule his  77-month follow-up  so we can recheck urine microalbumin

## 2022-10-12 ENCOUNTER — Encounter: Payer: Self-pay | Admitting: Family

## 2022-10-12 ENCOUNTER — Other Ambulatory Visit: Payer: Self-pay | Admitting: Family

## 2022-10-12 DIAGNOSIS — R809 Proteinuria, unspecified: Secondary | ICD-10-CM

## 2022-10-12 MED ORDER — LOSARTAN POTASSIUM 25 MG PO TABS: 12.5000 mg | ORAL_TABLET | Freq: Every day | ORAL | 2 refills | Status: DC

## 2022-10-12 NOTE — Telephone Encounter (Signed)
Called and spoke with pt.  He is picking up the medication tomorrow 10/13/22. Made a lab appointment for 10/19/22.  Completed safetyzone.

## 2022-10-12 NOTE — Progress Notes (Signed)
close

## 2022-10-15 ENCOUNTER — Telehealth: Payer: Self-pay | Admitting: Family

## 2022-10-15 ENCOUNTER — Encounter: Payer: Self-pay | Admitting: Internal Medicine

## 2022-10-15 NOTE — Telephone Encounter (Signed)
Fyi, Placed in pcp red folder to be filled out.

## 2022-10-15 NOTE — Telephone Encounter (Signed)
Pt came into the office to drop off FMLA paperwork. Placed in provider folder

## 2022-10-15 NOTE — Telephone Encounter (Signed)
NOTED

## 2022-10-15 NOTE — Telephone Encounter (Signed)
Spoke to pt, pt stated his lostarn pills where already small. Told pt top reach out to pharmacy to see if they had precut the pills. Pt stated he would reach out to pharmacy and if he had any other questions he would reach back out.

## 2022-10-16 NOTE — Telephone Encounter (Signed)
Completed FMLA paperwork and pt has been notified paperwork placed in brown folder up front  for pt pick up on 10/17/22

## 2022-10-18 NOTE — Addendum Note (Signed)
Encounter addended by: Mady Gemma, [facility] on: 10/18/2022 9:17 AM  Actions taken: Order Reconciliation Section accessed

## 2022-10-19 ENCOUNTER — Other Ambulatory Visit (INDEPENDENT_AMBULATORY_CARE_PROVIDER_SITE_OTHER): Payer: BC Managed Care – PPO

## 2022-10-19 DIAGNOSIS — R7303 Prediabetes: Secondary | ICD-10-CM | POA: Diagnosis not present

## 2022-10-20 LAB — BASIC METABOLIC PANEL
BUN: 20 mg/dL (ref 7–25)
CO2: 21 mmol/L (ref 20–32)
Calcium: 8.8 mg/dL (ref 8.6–10.3)
Chloride: 106 mmol/L (ref 98–110)
Creat: 0.84 mg/dL (ref 0.70–1.30)
Glucose, Bld: 104 mg/dL — ABNORMAL HIGH (ref 65–99)
Potassium: 3.9 mmol/L (ref 3.5–5.3)
Sodium: 139 mmol/L (ref 135–146)

## 2022-12-21 IMAGING — DX DG LUMBAR SPINE COMPLETE 4+V
5 series · 5 of 5 positions shown · non-contrast
Comparison: None.

CLINICAL DATA: Low back pain for 2 years without trauma.

EXAM:
LUMBAR SPINE - COMPLETE 4+ VIEW

[lumbar spine ap]
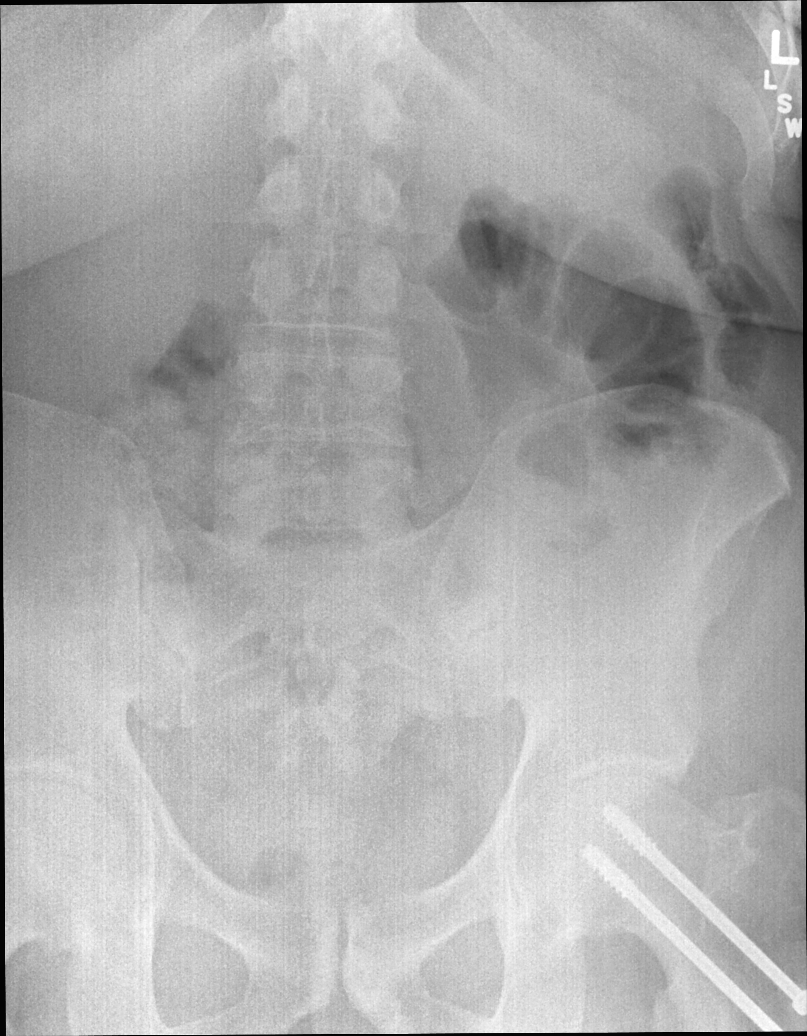

[lumbar spine obl (oblique) (1 of 2)]
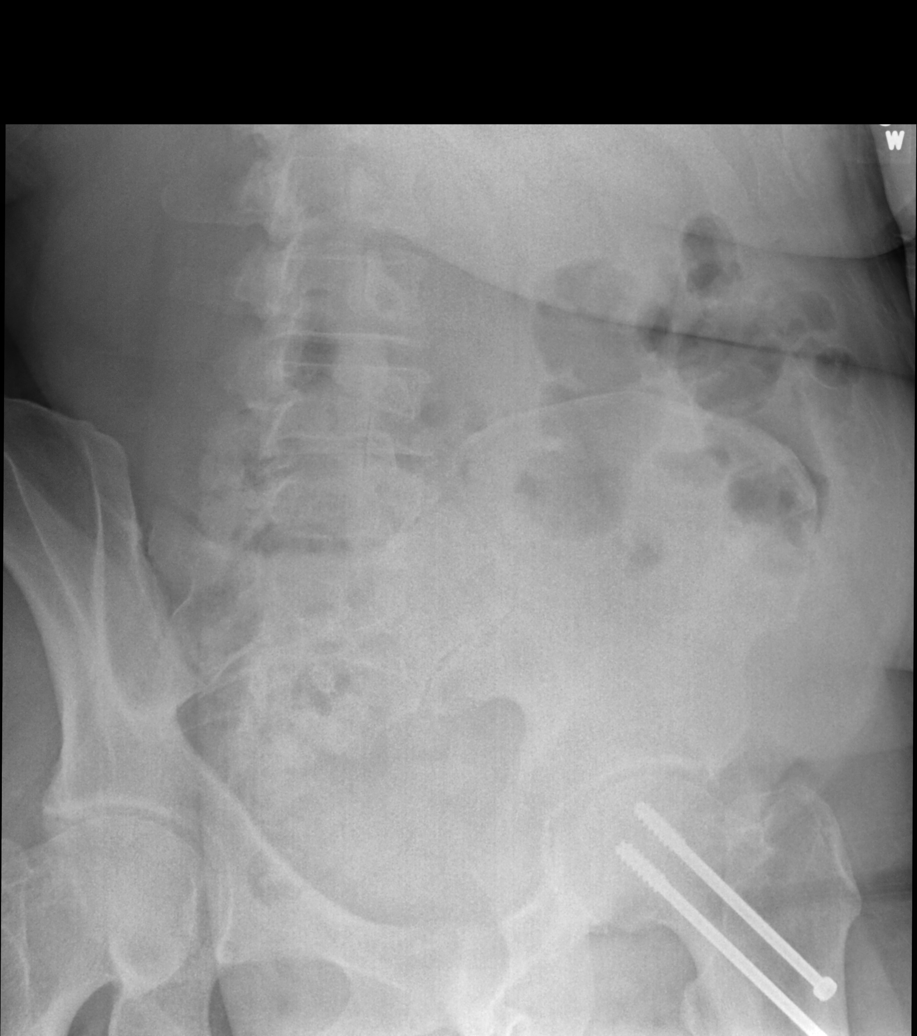

[lumbar spine obl (oblique) (2 of 2)]
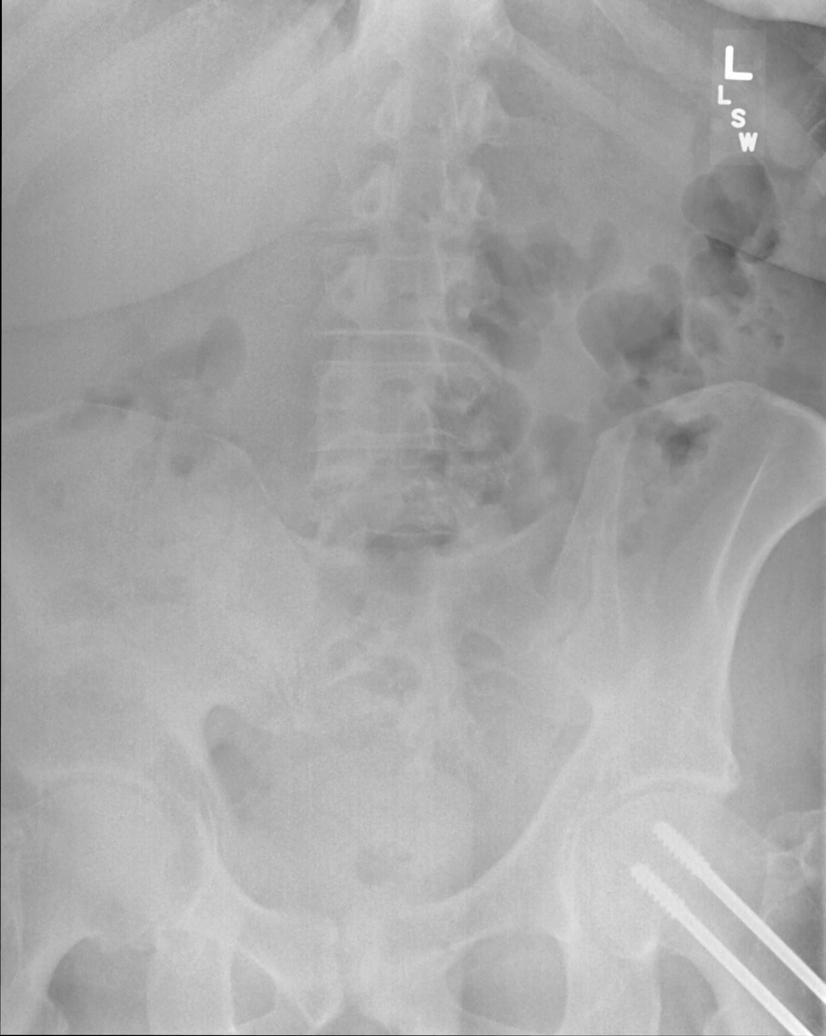

[lumbar spine lat]
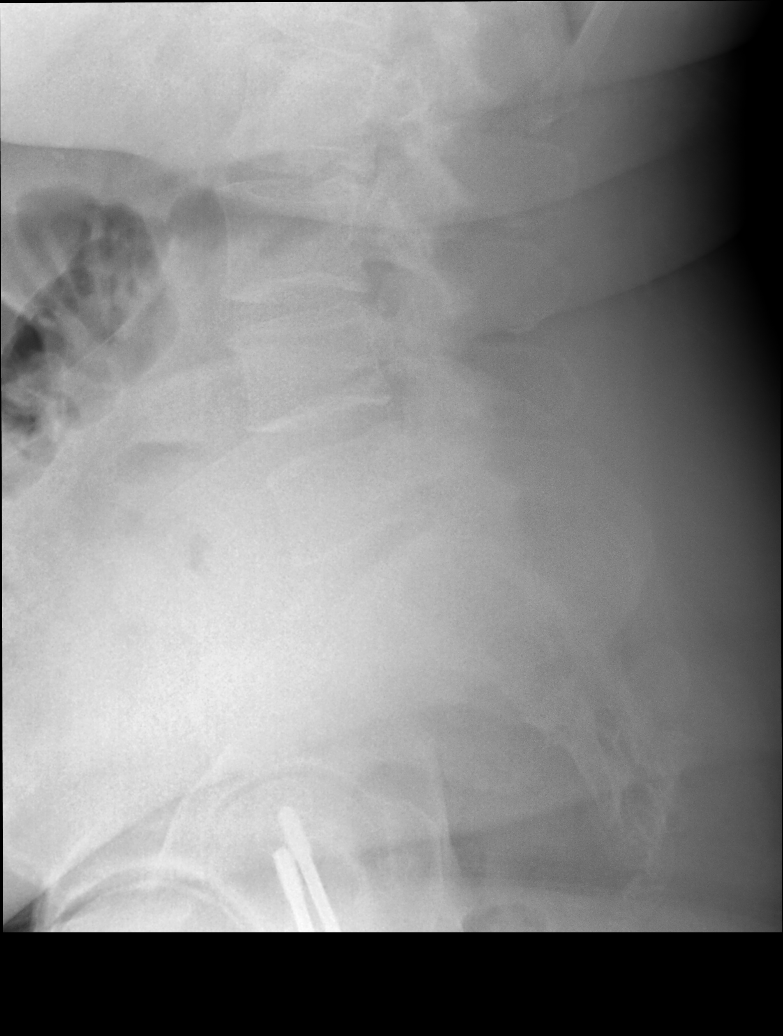

[lumbar spot lat]
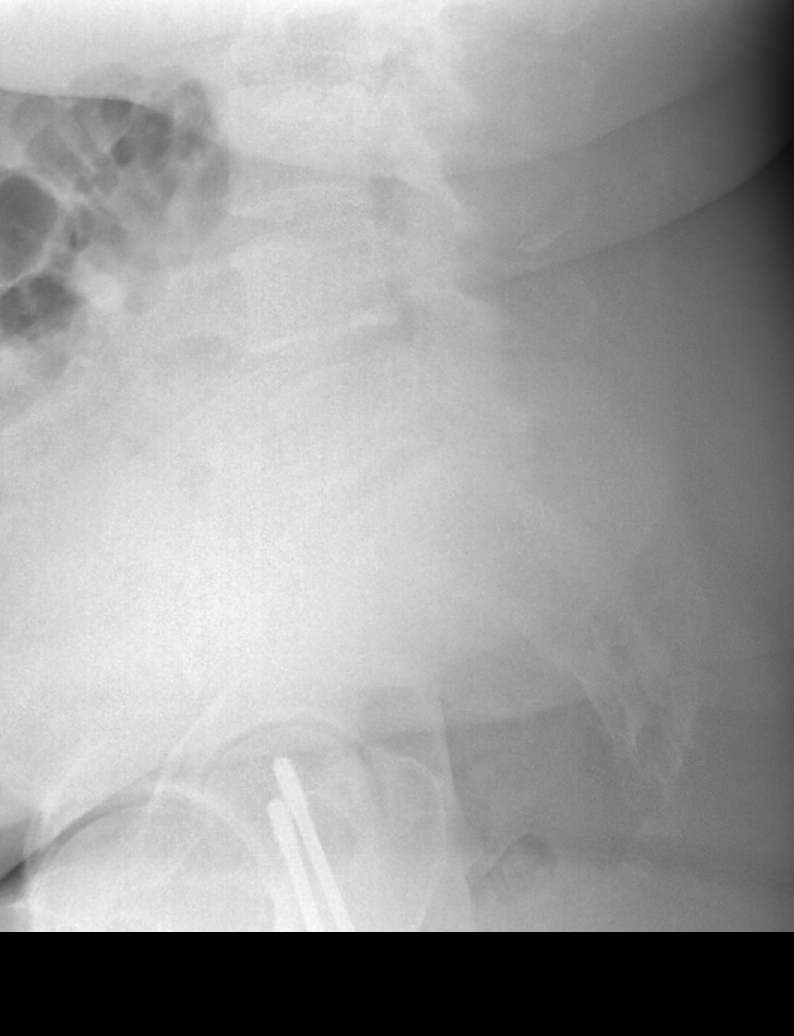

[5 of 5 positions shown; findings below may reference images not displayed]

FINDINGS: Proximal left femur fixation. Five lumbar type vertebral bodies.
Many views are under penetrated. Artifact degraded AP view. Grossly
symmetric sacroiliac joints.

Lateral view only images from L2 through the upper sacrum.
Maintenance of vertebral body height across these levels.
Intervertebral disc heights are maintained.
IMPRESSION: Limitations, including underpenetration on all views and exclusion
of the L1 level from the lateral view. Given these limitations, no
acute osseous abnormality.

## 2022-12-25 ENCOUNTER — Other Ambulatory Visit: Payer: Self-pay | Admitting: Family

## 2022-12-30 ENCOUNTER — Encounter: Payer: Self-pay | Admitting: Family

## 2022-12-31 ENCOUNTER — Telehealth: Payer: Self-pay

## 2022-12-31 ENCOUNTER — Other Ambulatory Visit (HOSPITAL_COMMUNITY): Payer: Self-pay

## 2022-12-31 NOTE — Telephone Encounter (Signed)
Pharmacy Patient Advocate Encounter   Received notification from Patient Advice Request messages that prior authorization for Wegovy 2.4MG /0.75ML auto-injectors is required/requested.   Insurance verification completed.   The patient is insured through [facility] .   Per test claim: PA required; PA started via CoverMyMeds. KEY [id number] . Waiting for clinical questions to populate.

## 2023-01-01 NOTE — Telephone Encounter (Signed)
Clinical questions answered. PA submitted

## 2023-01-03 NOTE — Telephone Encounter (Signed)
Spoke to pt his weight now is 284lbs

## 2023-01-03 NOTE — Telephone Encounter (Signed)
Additional information has been requested from the patient's insurance in order to proceed with the prior authorization request. Requested information has been sent, or form has been filled out and faxed back to [phone]

## 2023-01-03 NOTE — Telephone Encounter (Signed)
Prior Authorization form/request asks a question that requires your assistance. Please see the question below and advise accordingly.

## 2023-01-07 ENCOUNTER — Other Ambulatory Visit (HOSPITAL_COMMUNITY): Payer: Self-pay

## 2023-01-07 NOTE — Telephone Encounter (Signed)
PT is aware.

## 2023-01-07 NOTE — Telephone Encounter (Signed)
Pharmacy Patient Advocate Encounter  Received notification from [facility] that Prior Authorization for Wegovy 2.4MG /0.75ML auto-injectors has been APPROVED from 01/06/23 to 01/01/24. Ran test claim, Copay is $24.99. This test claim was processed through [facility]- copay amounts may vary at other pharmacies due to pharmacy/plan contracts, or as the patient moves through the different stages of their insurance plan.   PA #/Case ID/Reference #: [id number]

## 2023-02-13 ENCOUNTER — Ambulatory Visit: Payer: BC Managed Care – PPO | Admitting: Family

## 2023-02-13 ENCOUNTER — Encounter: Payer: Self-pay | Admitting: Family

## 2023-02-13 DIAGNOSIS — Z125 Encounter for screening for malignant neoplasm of prostate: Secondary | ICD-10-CM

## 2023-02-13 DIAGNOSIS — R809 Proteinuria, unspecified: Secondary | ICD-10-CM | POA: Diagnosis not present

## 2023-02-13 DIAGNOSIS — Z13220 Encounter for screening for lipoid disorders: Secondary | ICD-10-CM

## 2023-02-13 DIAGNOSIS — K5909 Other constipation: Secondary | ICD-10-CM

## 2023-02-13 DIAGNOSIS — Z136 Encounter for screening for cardiovascular disorders: Secondary | ICD-10-CM

## 2023-02-13 DIAGNOSIS — Z6835 Body mass index (BMI) 35.0-35.9, adult: Secondary | ICD-10-CM | POA: Diagnosis not present

## 2023-02-13 LAB — COMPREHENSIVE METABOLIC PANEL
ALT: 10 U/L (ref 0–53)
AST: 14 U/L (ref 0–37)
Albumin: 4.1 g/dL (ref 3.5–5.2)
Alkaline Phosphatase: 79 U/L (ref 39–117)
BUN: 15 mg/dL (ref 6–23)
CO2: 28 meq/L (ref 19–32)
Calcium: 9.1 mg/dL (ref 8.4–10.5)
Chloride: 103 meq/L (ref 96–112)
Creatinine, Ser: 0.67 mg/dL (ref 0.40–1.50)
GFR: 108.23 mL/min (ref >60.00)
Glucose, Bld: 90 mg/dL (ref 70–99)
Potassium: 3.8 meq/L (ref 3.5–5.1)
Sodium: 138 meq/L (ref 135–145)
Total Bilirubin: 0.8 mg/dL (ref 0.2–1.2)
Total Protein: 7 g/dL (ref 6.0–8.3)

## 2023-02-13 LAB — CBC WITH DIFFERENTIAL/PLATELET
Basophils Absolute: 0.1 10*3/uL (ref 0.0–0.1)
Basophils Relative: 1.3 % (ref 0.0–3.0)
Eosinophils Absolute: 0.2 10*3/uL (ref 0.0–0.7)
Eosinophils Relative: 4.3 % (ref 0.0–5.0)
HCT: 38.8 % — ABNORMAL LOW (ref 39.0–52.0)
Hemoglobin: 12.9 g/dL — ABNORMAL LOW (ref 13.0–17.0)
Lymphocytes Relative: 27.1 % (ref 12.0–46.0)
Lymphs Abs: 1.3 10*3/uL (ref 0.7–4.0)
MCHC: 33.1 g/dL (ref 30.0–36.0)
MCV: 85 fL (ref 78.0–100.0)
Monocytes Absolute: 0.4 10*3/uL (ref 0.1–1.0)
Monocytes Relative: 8.2 % (ref 3.0–12.0)
Neutro Abs: 2.9 10*3/uL (ref 1.4–7.7)
Neutrophils Relative %: 59.1 % (ref 43.0–77.0)
Platelets: 179 10*3/uL (ref 150.0–400.0)
RBC: 4.56 Mil/uL (ref 4.22–5.81)
RDW: 14.2 % (ref 11.5–15.5)
WBC: 5 10*3/uL (ref 4.0–10.5)

## 2023-02-13 LAB — LIPID PANEL
Cholesterol: 188 mg/dL (ref 0–200)
HDL: 62 mg/dL (ref >39.00)
LDL Cholesterol: 102 mg/dL — ABNORMAL HIGH (ref 0–99)
NonHDL: 126.16
Total CHOL/HDL Ratio: 3
Triglycerides: 119 mg/dL (ref 0.0–149.0)
VLDL: 23.8 mg/dL (ref 0.0–40.0)

## 2023-02-13 LAB — MICROALBUMIN / CREATININE URINE RATIO
Creatinine,U: 119.1 mg/dL
Microalb Creat Ratio: 0.6 mg/g (ref 0.0–30.0)
Microalb, Ur: 0.7 mg/dL (ref 0.0–1.9)

## 2023-02-13 LAB — PSA: PSA: 0.55 ng/mL (ref 0.10–4.00)

## 2023-02-13 LAB — TSH: TSH: 1.63 u[IU]/mL (ref 0.35–5.50)

## 2023-02-13 LAB — VITAMIN D 25 HYDROXY (VIT D DEFICIENCY, FRACTURES): VITD: 15.37 ng/mL — ABNORMAL LOW (ref 30.00–100.00)

## 2023-02-13 MED ORDER — LINACLOTIDE 145 MCG PO CAPS: 145.0000 ug | ORAL_CAPSULE | Freq: Every day | ORAL | 1 refills | Status: DC | PRN

## 2023-02-13 NOTE — Assessment & Plan Note (Addendum)
Congratulated patient on excellent results on Wegovy.  Continue Wegovy 2.4 mg once weekly for maintenance

## 2023-02-13 NOTE — Assessment & Plan Note (Signed)
Compliant with losartan 12.5 mg daily.  Doing well on this small dose.  Pending urine microalbumin.

## 2023-02-13 NOTE — Assessment & Plan Note (Signed)
Chronic, this predates starting 47.  Symptom unchanged and well-managed with as needed use of Linzess.  Counseled on use of MiraLAX for maintenance as well.  No alarm features at this time.  Colonoscopy is UTD.

## 2023-02-13 NOTE — Progress Notes (Signed)
Assessment & Plan:  Morbid obesity [facility]) Assessment & Plan: Congratulated patient on excellent results on Wegovy.  Continue Wegovy 2.4 mg once weekly for maintenance  Orders: -     VITAMIN D 25 Hydroxy (Vit-D Deficiency, Fractures) -     CBC with Differential/Platelet -     Comprehensive metabolic panel -     TSH  Microalbuminuria Assessment & Plan: Compliant with losartan 12.5 mg daily.  Doing well on this small dose.  Pending urine microalbumin.  Orders: -     Microalbumin / creatinine urine ratio  Encounter for lipid screening for cardiovascular disease -     Lipid panel  Screening for prostate cancer -     PSA  Chronic constipation Assessment & Plan: Chronic, this predates starting Wegovy.  Symptom unchanged and well-managed with as needed use of Linzess.  Counseled on use of MiraLAX for maintenance as well.  No alarm features at this time.  Colonoscopy is UTD.   Orders: -     linaCLOtide; Take 1 capsule (145 mcg total) by mouth daily as needed.  Dispense: 30 capsule; Refill: 1     Return precautions given.   Risks, benefits, and alternatives of the medications and treatment plan prescribed today were discussed, and patient expressed understanding.   Education regarding symptom management and diagnosis given to patient on AVS either electronically or printed.  Return in about 6 months (around 08/14/2023).  Rennie Plowman, FNP  Subjective:    Patient ID: Seth Simmons, male    DOB: [birth year], 52 y.o.   MRN: [id number]  CC: Seth Simmons is a 52 y.o. male who presents today for follow up.   HPI: He feels well today No new complaints.   He is pleased with weight loss, however appetite suppression has been less.  Goal weight of 275 lbs   Constipation is unchanged since starting wegovy.  Request refill Linzess which he would use after 2-3 days if constipated. He was originally provided samples per patient by Dr Tobi Bastos.  No blood in stool , thin stool  diameter.   Colonoscopy up-to-date 01/28/2020, repeat in 7 years  Allergies: Patient has no known allergies. Current Outpatient Medications on File Prior to Visit  Medication Sig Dispense Refill   gabapentin (NEURONTIN) 100 MG capsule Take 1 capsule (100 mg total) by mouth 3 (three) times daily. 90 capsule 3   losartan (COZAAR) 25 MG tablet Take 0.5 tablets (12.5 mg total) by mouth daily. 60 tablet 2   naproxen sodium (ALEVE) 220 MG tablet Take 220 mg by mouth.     Semaglutide-Weight Management (WEGOVY) 2.4 MG/0.75ML SOAJ INJECT INTO SKIN ONCE WEEKLY 1 mL 2   traMADol (ULTRAM) 50 MG tablet Take 1 tablet (50 mg total) by mouth every 8 (eight) hours as needed. 30 tablet 1   No current facility-administered medications on file prior to visit.    Review of Systems  Constitutional:  Negative for chills and fever.  Respiratory:  Negative for cough.   Cardiovascular:  Negative for chest pain and palpitations.  Gastrointestinal:  Positive for constipation (chronic). Negative for nausea and vomiting.      Objective:    BP 128/76   Pulse 96   Temp 98 F (36.7 C) (Oral)   Ht 6\' 3"  (1.905 m)   Wt 283 lb 3.2 oz (128.5 kg)   SpO2 98%   BMI 35.40 kg/m  BP Readings from Last 3 Encounters:  02/13/23 128/76  08/13/22 130/76  03/09/22 118/78   Wt  Readings from Last 3 Encounters:  02/13/23 283 lb 3.2 oz (128.5 kg)  08/13/22 (!) 316 lb 3.2 oz (143.4 kg)  03/09/22 (!) 337 lb 3.2 oz (153 kg)    Physical Exam Vitals reviewed.  Constitutional:      Appearance: He is well-developed.  Cardiovascular:     Rate and Rhythm: Regular rhythm.     Heart sounds: Normal heart sounds.  Pulmonary:     Effort: Pulmonary effort is normal. No respiratory distress.     Breath sounds: Normal breath sounds. No wheezing, rhonchi or rales.  Skin:    General: Skin is warm and dry.  Neurological:     Mental Status: He is alert.  Psychiatric:        Speech: Speech normal.        Behavior: Behavior normal.

## 2023-02-14 ENCOUNTER — Other Ambulatory Visit: Payer: Self-pay | Admitting: Family

## 2023-02-14 DIAGNOSIS — Z8639 Personal history of other endocrine, nutritional and metabolic disease: Secondary | ICD-10-CM

## 2023-02-14 DIAGNOSIS — D649 Anemia, unspecified: Secondary | ICD-10-CM

## 2023-02-14 MED ORDER — CHOLECALCIFEROL 1.25 MG (50000 UT) PO TABS: ORAL_TABLET | ORAL | 0 refills | Status: DC

## 2023-02-15 ENCOUNTER — Other Ambulatory Visit (INDEPENDENT_AMBULATORY_CARE_PROVIDER_SITE_OTHER): Payer: BC Managed Care – PPO

## 2023-02-15 DIAGNOSIS — D649 Anemia, unspecified: Secondary | ICD-10-CM | POA: Diagnosis not present

## 2023-02-15 NOTE — Addendum Note (Signed)
Addended by: Warden Fillers on: 02/15/2023 02:08 PM   Modules accepted: Orders

## 2023-02-16 ENCOUNTER — Other Ambulatory Visit: Payer: Self-pay | Admitting: Family

## 2023-02-16 DIAGNOSIS — D649 Anemia, unspecified: Secondary | ICD-10-CM

## 2023-02-16 LAB — URINALYSIS, ROUTINE W REFLEX MICROSCOPIC
Bilirubin, UA: NEGATIVE
Glucose, UA: NEGATIVE
Ketones, UA: NEGATIVE
Leukocytes,UA: NEGATIVE
Nitrite, UA: NEGATIVE
Protein,UA: NEGATIVE
RBC, UA: NEGATIVE
Specific Gravity, UA: 1.025 (ref 1.005–1.030)
Urobilinogen, Ur: 1 mg/dL (ref 0.2–1.0)
pH, UA: 6.5 (ref 5.0–7.5)

## 2023-02-16 LAB — B12 AND FOLATE PANEL
Folate: 11.9 ng/mL (ref >3.0)
Vitamin B-12: 417 pg/mL (ref 232–1245)

## 2023-02-16 LAB — IRON,TIBC AND FERRITIN PANEL
Ferritin: 348 ng/mL (ref 30–400)
Iron Saturation: 15 % (ref 15–55)
Iron: 38 ug/dL (ref 38–169)
Total Iron Binding Capacity: 254 ug/dL (ref 250–450)
UIBC: 216 ug/dL (ref 111–343)

## 2023-02-18 ENCOUNTER — Encounter: Payer: Self-pay | Admitting: Family

## 2023-02-19 NOTE — Telephone Encounter (Signed)
Spoke to pt and offered him an appt today to be seen but he declined stated that he felt better today and did not need to come in to be seen.

## 2023-03-06 ENCOUNTER — Other Ambulatory Visit: Payer: Self-pay | Admitting: Family

## 2023-03-06 DIAGNOSIS — Z8639 Personal history of other endocrine, nutritional and metabolic disease: Secondary | ICD-10-CM

## 2023-03-11 NOTE — Telephone Encounter (Signed)
 Spoke to ot he still has 1 tablet left before he finishes the 8 weeks pt will call and schedule lab appt to recheck vit D

## 2023-03-12 ENCOUNTER — Encounter: Payer: Self-pay | Admitting: Family

## 2023-03-13 ENCOUNTER — Other Ambulatory Visit: Payer: Self-pay

## 2023-03-13 DIAGNOSIS — R7303 Prediabetes: Secondary | ICD-10-CM

## 2023-03-13 NOTE — Telephone Encounter (Signed)
 Spoke to pt and scheduled Vit D labs to recheck labs ordered as well

## 2023-03-14 ENCOUNTER — Other Ambulatory Visit (INDEPENDENT_AMBULATORY_CARE_PROVIDER_SITE_OTHER): Payer: BC Managed Care – PPO

## 2023-03-14 DIAGNOSIS — R7303 Prediabetes: Secondary | ICD-10-CM

## 2023-03-14 DIAGNOSIS — D649 Anemia, unspecified: Secondary | ICD-10-CM

## 2023-03-15 ENCOUNTER — Encounter: Payer: Self-pay | Admitting: Family

## 2023-03-15 ENCOUNTER — Other Ambulatory Visit: Payer: Self-pay | Admitting: Family

## 2023-03-15 DIAGNOSIS — Z8639 Personal history of other endocrine, nutritional and metabolic disease: Secondary | ICD-10-CM

## 2023-03-15 LAB — IRON,TIBC AND FERRITIN PANEL
Ferritin: 296 ng/mL (ref 30–400)
Iron Saturation: 15 % (ref 15–55)
Iron: 33 ug/dL — ABNORMAL LOW (ref 38–169)
Total Iron Binding Capacity: 225 ug/dL — ABNORMAL LOW (ref 250–450)
UIBC: 192 ug/dL (ref 111–343)

## 2023-03-15 LAB — CBC WITH DIFFERENTIAL/PLATELET
Basophils Absolute: 0.1 10*3/uL (ref 0.0–0.2)
Basos: 1 %
EOS (ABSOLUTE): 0.1 10*3/uL (ref 0.0–0.4)
Eos: 3 %
Hematocrit: 40.4 % (ref 37.5–51.0)
Hemoglobin: 13.3 g/dL (ref 13.0–17.7)
Immature Grans (Abs): 0 10*3/uL (ref 0.0–0.1)
Immature Granulocytes: 0 %
Lymphocytes Absolute: 1.6 10*3/uL (ref 0.7–3.1)
Lymphs: 41 %
MCH: 28.3 pg (ref 26.6–33.0)
MCHC: 32.9 g/dL (ref 31.5–35.7)
MCV: 86 fL (ref 79–97)
Monocytes Absolute: 0.3 10*3/uL (ref 0.1–0.9)
Monocytes: 9 %
Neutrophils Absolute: 1.8 10*3/uL (ref 1.4–7.0)
Neutrophils: 46 %
Platelets: 188 10*3/uL (ref 150–450)
RBC: 4.7 x10E6/uL (ref 4.14–5.80)
RDW: 13.1 % (ref 11.6–15.4)
WBC: 3.9 10*3/uL (ref 3.4–10.8)

## 2023-03-15 LAB — VITAMIN D 25 HYDROXY (VIT D DEFICIENCY, FRACTURES): VITD: 23.69 ng/mL — ABNORMAL LOW (ref 30.00–100.00)

## 2023-03-15 MED ORDER — CHOLECALCIFEROL 1.25 MG (50000 UT) PO TABS: ORAL_TABLET | ORAL | 0 refills | Status: DC

## 2023-03-21 ENCOUNTER — Other Ambulatory Visit: Payer: Self-pay | Admitting: Family

## 2023-04-18 ENCOUNTER — Ambulatory Visit
Admission: EM | Admit: 2023-04-18 | Discharge: 2023-04-18 | Disposition: A | Discharge status: Home / Self Care | Payer: BC Managed Care – PPO | Attending: Emergency Medicine | Admitting: Emergency Medicine

## 2023-04-18 DIAGNOSIS — L0211 Cutaneous abscess of neck: Secondary | ICD-10-CM

## 2023-04-18 MED ORDER — DOXYCYCLINE HYCLATE 100 MG PO CAPS: 100.0000 mg | ORAL_CAPSULE | Freq: Two times a day (BID) | ORAL | 0 refills | Status: DC

## 2023-04-18 NOTE — ED Triage Notes (Signed)
Bump/abscess on left side of neck that started 5 days ago. Tried popping it then it got red and painful.

## 2023-04-18 NOTE — ED Provider Notes (Signed)
Renaldo Fiddler    CSN: [id number] Arrival date & time: 04/18/23  1208      History   Chief Complaint Chief Complaint  Patient presents with   Abscess    HPI Reyce Lubeck is a 53 y.o. male.   Patient presents for evaluation of a bump to the left side of the neck beginning 5 days ago.  Attempted to pop and drain the area, ineffective at that time but started to drain later that day.  Denies fever.  Has not attempted treatment further.  Past Medical History:  Diagnosis Date   Anemia    Arthritis    Asthma    Genital warts    Hepatitis    Obesity     Patient Active Problem List   Diagnosis Date Noted   Chronic constipation 02/13/2023   Microalbuminuria 08/13/2022   Right hand pain 03/09/2022   Right knee pain 01/08/2022   Fecal incontinence 04/21/2021   Urinary hesitancy 02/06/2021   Low back pain 12/07/2020   Choking 12/07/2020   Encounter for screening colonoscopy 12/24/2019   Screen for STD (sexually transmitted disease) 12/02/2019   Genital warts    Dizziness    Liver fibrosis 09/24/2018   OSA (obstructive sleep apnea) 09/24/2018   Fever 09/17/2018   Snores 06/27/2018   Wheezing 06/13/2018   Prediabetes 12/11/2017   Palpitations 10/11/2017   Routine physical examination 10/11/2017   Herpes simplex type 2 infection 08/18/2014   Morbid obesity (HCC) 08/18/2014   Arthritis of knee, degenerative 08/18/2014   Abnormal immunological findings in specimens from other organs, systems and tissues 08/18/2014   Herpesviral infection 08/18/2014   Personal history of other infectious and parasitic diseases 08/18/2014   Hepatitis C virus infection without hepatic coma 08/12/2014   Liver lesion, left lobe 08/12/2014    Past Surgical History:  Procedure Laterality Date   COLONOSCOPY WITH PROPOFOL N/A 01/28/2020   Procedure: COLONOSCOPY WITH PROPOFOL;  Surgeon: Wyline Mood, MD;  Location: [facility] ENDOSCOPY;  Service: Gastroenterology;  Laterality: N/A;    ESOPHAGOGASTRODUODENOSCOPY (EGD) WITH PROPOFOL N/A 10/21/2014   Procedure: ESOPHAGOGASTRODUODENOSCOPY (EGD) WITH PROPOFOL;  Surgeon: Elnita Maxwell, MD;  Location: [facility] ENDOSCOPY;  Service: Endoscopy;  Laterality: N/A;   HIP SURGERY Left        Home Medications    Prior to Admission medications   Medication Sig Start Date End Date Taking? Authorizing Provider  Cholecalciferol 1.25 MG (50000 UT) TABS 50,000 units PO qwk for 8 weeks. 03/15/23  Yes Arnett, Lyn Records, FNP  doxycycline (VIBRAMYCIN) 100 MG capsule Take 1 capsule (100 mg total) by mouth 2 (two) times daily. 04/18/23  Yes Nivaan Dicenzo R, NP  losartan (COZAAR) 25 MG tablet Take 0.5 tablets (12.5 mg total) by mouth daily. 10/12/22  Yes Allegra Grana, FNP  Semaglutide-Weight Management (WEGOVY) 2.4 MG/0.75ML SOAJ INJECT INTO SKIN ONCE WEEKLY 03/21/23  Yes Allegra Grana, FNP  gabapentin (NEURONTIN) 100 MG capsule Take 1 capsule (100 mg total) by mouth 3 (three) times daily. 03/09/22   Allegra Grana, FNP  linaclotide (LINZESS) 145 MCG CAPS capsule Take 1 capsule (145 mcg total) by mouth daily as needed. 02/13/23   Allegra Grana, FNP  naproxen sodium (ALEVE) 220 MG tablet Take 220 mg by mouth.    [provider]  traMADol (ULTRAM) 50 MG tablet Take 1 tablet (50 mg total) by mouth every 8 (eight) hours as needed. 03/09/22   Allegra Grana, FNP    Family History Family History  Problem Relation Age of Onset   Diabetes Mother    Diabetes Father    Diabetes Brother    Cancer Brother    Diabetes Brother    Colon cancer Neg Hx     Social History Social History   Tobacco Use   Smoking status: Former    Types: Cigars   Smokeless tobacco: Never   Tobacco comments:    maybe 1 cigar every 3 months , last one 2022.   Vaping Use   Vaping status: Never Used  Substance Use Topics   Alcohol use: Yes    Alcohol/week: 0.0 standard drinks of alcohol    Comment: weekends;    Drug use: No      Allergies   Patient has no known allergies.   Review of Systems Review of Systems   Physical Exam Triage Vital Signs ED Triage Vitals [04/18/23 1347]  Encounter Vitals Group     BP 111/73     Systolic BP Percentile      Diastolic BP Percentile      Pulse Rate 85     Resp 18     Temp 98 F (36.7 C)     Temp Source Oral     SpO2 98 %     Weight      Height      Head Circumference      Peak Flow      Pain Score 7     Pain Loc      Pain Education      Exclude from Growth Chart    No data found.  Updated Vital Signs BP 111/73 (BP Location: Left Arm)   Pulse 85   Temp 98 F (36.7 C) (Oral)   Resp 18   SpO2 98%   Visual Acuity Right Eye Distance:   Left Eye Distance:   Bilateral Distance:    Right Eye Near:   Left Eye Near:    Bilateral Near:     Physical Exam Constitutional:      Appearance: Normal appearance.  Eyes:     Extraocular Movements: Extraocular movements intact.  Pulmonary:     Effort: Pulmonary effort is normal.  Skin:    Comments: 1x2 cm abscess present to the left lateral of neck, green to yellow purulent drainage present, able to expel with pressure  Neurological:     Mental Status: He is alert and oriented to person, place, and time. Mental status is at baseline.      UC Treatments / Results  Labs (all labs ordered are listed, but only abnormal results are displayed) Labs Reviewed - No data to display  EKG   Radiology No results found.  Procedures Procedures (including critical care time)  Medications Ordered in UC Medications - No data to display  Initial Impression / Assessment and Plan / UC Course  I have reviewed the triage vital signs and the nursing notes.  Pertinent labs & imaging results that were available during my care of the patient were reviewed by me and considered in my medical decision making (see chart for details).  Abscess of neck  Will not need I&D today as site already draining, discussed,  prescribed doxycycline and recommended additional supportive measures with follow-up for nonhealing wound Final Clinical Impressions(s) / UC Diagnoses   Final diagnoses:  Abscess of neck     Discharge Instructions      Take doxycyline twice a day for 7 days  Hold warm-hot compresses to affected area at  least 4 times a day, this helps to facilitate draining, the more the better  Please return for evaluation for increased swelling, increased tenderness or pain, non healing site, non draining site, you begin to have fever or chills   We reviewed the etiology of recurrent abscesses of skin.  Skin abscesses are collections of pus within the dermis and deeper skin tissues. Skin abscesses manifest as painful, tender, fluctuant, and erythematous nodules, frequently surmounted by a pustule and surrounded by a rim of erythematous swelling.  Spontaneous drainage of purulent material may occur.  Fever can occur on occasion.    -Skin abscesses can develop in healthy individuals with no predisposing conditions other than skin or nasal carriage of Staphylococcus aureus.  Individuals in close contact with others who have active infection with skin abscesses are at increased risk which is likely to explain why twin brother has similar episodes.   In addition, any process leading to a breach in the skin barrier can also predispose to the development of a skin abscesses, such as atopic dermatitis.      ED Prescriptions     Medication Sig Dispense Auth. Provider   doxycycline (VIBRAMYCIN) 100 MG capsule Take 1 capsule (100 mg total) by mouth 2 (two) times daily. 14 capsule Brook Geraci, Elita Boone, NP      PDMP not reviewed this encounter.   Valinda Hoar, NP 04/18/23 1401

## 2023-04-18 NOTE — Discharge Instructions (Addendum)
Take doxycyline twice a day for 7 days.   Hold warm-hot compresses to affected area at least 4 times a day, this helps to facilitate draining, the more the better  Please return for evaluation for increased swelling, increased tenderness or pain, non healing site, non draining site, you begin to have fever or chills   We reviewed the etiology of recurrent abscesses of skin.  Skin abscesses are collections of pus within the dermis and deeper skin tissues. Skin abscesses manifest as painful, tender, fluctuant, and erythematous nodules, frequently surmounted by a pustule and surrounded by a rim of erythematous swelling.  Spontaneous drainage of purulent material may occur.  Fever can occur on occasion.    -Skin abscesses can develop in healthy individuals with no predisposing conditions other than skin or nasal carriage of Staphylococcus aureus.  Individuals in close contact with others who have active infection with skin abscesses are at increased risk which is likely to explain why twin brother has similar episodes.   In addition, any process leading to a breach in the skin barrier can also predispose to the development of a skin abscesses, such as atopic dermatitis.    

## 2023-04-29 ENCOUNTER — Encounter: Payer: Self-pay | Admitting: Family

## 2023-04-29 NOTE — Telephone Encounter (Signed)
 Spoke to pt regarding the Losartan 25 mg. Pt thinks he is not suppose to be taking this , but the pharmacy continues refilling, do I need to d/c in the chart  or no

## 2023-05-02 ENCOUNTER — Encounter: Payer: Self-pay | Admitting: Family

## 2023-05-10 ENCOUNTER — Other Ambulatory Visit: Payer: Self-pay | Admitting: Family

## 2023-05-10 DIAGNOSIS — Z8639 Personal history of other endocrine, nutritional and metabolic disease: Secondary | ICD-10-CM

## 2023-05-13 ENCOUNTER — Encounter: Payer: Self-pay | Admitting: Family

## 2023-05-13 NOTE — Telephone Encounter (Signed)
 Spoke to pt explained to him that it is Vit D and we do not automatically refill until your vit d is rechecked. Pt verbalized understanding

## 2023-06-20 ENCOUNTER — Other Ambulatory Visit: Payer: Self-pay | Admitting: Family

## 2023-07-16 ENCOUNTER — Other Ambulatory Visit: Payer: Self-pay | Admitting: Family

## 2023-08-05 DIAGNOSIS — R42 Dizziness and giddiness: Secondary | ICD-10-CM | POA: Diagnosis not present

## 2023-08-10 ENCOUNTER — Other Ambulatory Visit: Payer: Self-pay | Admitting: Family

## 2023-08-14 ENCOUNTER — Ambulatory Visit: Payer: BC Managed Care – PPO | Admitting: Family

## 2023-08-21 ENCOUNTER — Encounter: Payer: Self-pay | Admitting: Family

## 2023-08-21 ENCOUNTER — Ambulatory Visit: Admitting: Family

## 2023-08-21 VITALS — BP 124/70 | HR 75 | Temp 98.0°F | Ht 76.0 in | Wt 279.0 lb

## 2023-08-21 DIAGNOSIS — Z6833 Body mass index (BMI) 33.0-33.9, adult: Secondary | ICD-10-CM | POA: Diagnosis not present

## 2023-08-21 DIAGNOSIS — R42 Dizziness and giddiness: Secondary | ICD-10-CM

## 2023-08-21 DIAGNOSIS — R809 Proteinuria, unspecified: Secondary | ICD-10-CM

## 2023-08-21 DIAGNOSIS — G4733 Obstructive sleep apnea (adult) (pediatric): Secondary | ICD-10-CM

## 2023-08-21 DIAGNOSIS — Z125 Encounter for screening for malignant neoplasm of prostate: Secondary | ICD-10-CM

## 2023-08-21 DIAGNOSIS — M79641 Pain in right hand: Secondary | ICD-10-CM | POA: Diagnosis not present

## 2023-08-21 LAB — MICROALBUMIN / CREATININE URINE RATIO
Creatinine,U: 190.8 mg/dL
Microalb Creat Ratio: 4.8 mg/g (ref 0.0–30.0)
Microalb, Ur: 0.9 mg/dL (ref 0.0–1.9)

## 2023-08-21 LAB — LIPID PANEL
Cholesterol: 175 mg/dL (ref 0–200)
HDL: 76.7 mg/dL (ref >39.00)
LDL Cholesterol: 88 mg/dL (ref 0–99)
NonHDL: 98.63
Total CHOL/HDL Ratio: 2
Triglycerides: 52 mg/dL (ref 0.0–149.0)
VLDL: 10.4 mg/dL (ref 0.0–40.0)

## 2023-08-21 LAB — VITAMIN D 25 HYDROXY (VIT D DEFICIENCY, FRACTURES): VITD: 27.27 ng/mL — ABNORMAL LOW (ref 30.00–100.00)

## 2023-08-21 LAB — TSH: TSH: 0.98 u[IU]/mL (ref 0.35–5.50)

## 2023-08-21 LAB — PSA: PSA: 0.71 ng/mL (ref 0.10–4.00)

## 2023-08-21 LAB — HEMOGLOBIN A1C: Hgb A1c MFr Bld: 5.4 % (ref 4.6–6.5)

## 2023-08-21 MED ORDER — WEGOVY 2.4 MG/0.75ML ~~LOC~~ SOAJ: SUBCUTANEOUS | 0 refills | Status: DC

## 2023-08-21 NOTE — Assessment & Plan Note (Addendum)
 Symptom a/w heat , working outdoors, and likely exacerbated by hypoglycemia, dehydration, and anti hypertensive. He is not orthostatic ( see vitals).  Stop losartan  12.5mg  as started d/t proteinuria.  Counseled on hydration and small frequent meals ,particularly on wegovy . Consider dose decrease of wegovy  if nausea is persistent or weight loss becomes to fast. ( > 2 lb/wk).

## 2023-08-21 NOTE — Patient Instructions (Addendum)
 We may consider changing to Zepbound in the future as approved for sleep apnea  I have refilled wegovy  for now.   Please eat small and frequent meals.   Trial STOP of losartan   Referral to Dr Salina Hails neurosurgery [phone] Dr Norleen KYM Salina, MD  Please let me know if any recurrence of dizziness.

## 2023-08-21 NOTE — Progress Notes (Signed)
 Assessment & Plan:  Dizziness Assessment & Plan: Symptom a/w heat , working outdoors, and likely exacerbated by hypoglycemia, dehydration, and anti hypertensive. He is not orthostatic ( see vitals).  Stop losartan  12.5mg  as started d/t proteinuria.  Counseled on hydration and small frequent meals ,particularly on wegovy . Consider dose decrease of wegovy  if nausea is persistent or weight loss becomes to fast. ( > 2 lb/wk).   Orders: -     Microalbumin / creatinine urine ratio  Right hand pain -     Ambulatory referral to Orthopedic Surgery  Morbid obesity (HCC) -     TSH -     Hemoglobin A1c -     Lipid panel -     VITAMIN D  25 Hydroxy (Vit-D Deficiency, Fractures) -     Wegovy ; INJECT 2.4MG  INTO SKIN ONCE WEEKLY  Dispense: 0.75 mL; Refill: 0  OSA (obstructive sleep apnea) Assessment & Plan: Congratulated patient on weight loss.  Continue Wegovy  2.4 mg weekly.  Discussed Zepbound if Wegovy  is not approved by insurance in the future.    Screening for prostate cancer -     PSA  Microalbuminuria     Return precautions given.   Risks, benefits, and alternatives of the medications and treatment plan prescribed today were discussed, and patient expressed understanding.   Education regarding symptom management and diagnosis given to patient on AVS either electronically or printed.  No follow-ups on file.  Seth Northern, FNP  Subjective:    Patient ID: Seth Simmons, male    DOB: 1970/03/25, 53 y.o.   MRN: [id number]  CC: Seth Simmons is a 53 y.o. male who presents today for follow up.   HPI: He has done well on wegovy  and is pleased with medication.  Tolerating well. Denies constipation.  He may have very brief nausea for 20 minutes rarely and then resolves.   346 lbs in 2023; he has lost 70 lbs.   He describes an episode of dizziness x 10 days ago while holding onto truck, when working for [employer]. 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    No breakfast that morning. No regular caffeine use.  No associated CP, sob, syncope, HA, vision changes, nausea.   He has noticed that he will feel dizzy not in the morning but when 'heat comes on' when working on the truck.   Symptom improved with rest and eating. He went to [facility] , blood sugar 50. He had EKG , labs.   He has dizziness 'alot' with bending his head over. He is not dizzy today.   On wegovy , he will skip meals.      [facility] 08/05/23; unable to see visit notes  EKG reported NSR without ST elevation Glucose 50 Hemoglobin 13.9 Crt 1.03  Seen by dr dow 04/2022 for shoulder pain, numbness. He would like a second opinion with Dr Salina, [facility]>   Allergies: Patient has no known allergies. Current Outpatient Medications on File Prior to Visit  Medication Sig Dispense Refill   naproxen sodium (ALEVE) 220 MG tablet Take 220 mg by mouth.     No current facility-administered medications on file prior to visit.    Review of Systems  Constitutional:  Negative for chills and fever.  Eyes:  Negative for visual disturbance.  Respiratory:  Negative for cough.   Cardiovascular:  Negative for chest pain and palpitations.  Gastrointestinal:  Negative for nausea and vomiting.  Neurological:  Positive for dizziness. Negative  for syncope and headaches.      Objective:    BP 124/70   Pulse 75   Temp 98 F (36.7 C) (Oral)   Ht 6' 4 (1.93 m)   Wt 279 lb (126.6 kg)   SpO2 96%   BMI 33.96 kg/m  BP Readings from Last 3 Encounters:  08/21/23 124/70  04/18/23 111/73  02/13/23 128/76   Wt Readings from Last 3 Encounters:  08/21/23 279 lb (126.6 kg)  02/13/23 283 lb 3.2 oz (128.5 kg)  08/13/22 (!) 316 lb 3.2 oz (143.4 kg)   No data found.   Physical Exam Vitals reviewed.  Constitutional:      Appearance: He is well-developed.   Cardiovascular:     Rate and Rhythm: Regular rhythm.     Heart sounds: Normal heart sounds.  Pulmonary:      Effort: Pulmonary effort is normal. No respiratory distress.     Breath sounds: Normal breath sounds. No wheezing, rhonchi or rales.   Musculoskeletal:     Right lower leg: No edema.     Left lower leg: No edema.   Skin:    General: Skin is warm and dry.   Neurological:     Mental Status: He is alert.   Psychiatric:        Speech: Speech normal.        Behavior: Behavior normal.

## 2023-08-22 ENCOUNTER — Ambulatory Visit: Payer: Self-pay | Admitting: Family

## 2023-08-26 NOTE — Assessment & Plan Note (Signed)
 Congratulated patient on weight loss.  Continue Wegovy  2.4 mg weekly.  Discussed Zepbound if Wegovy  is not approved by insurance in the future.

## 2023-08-26 NOTE — Assessment & Plan Note (Deleted)
 Discussed dizziness

## 2023-09-03 ENCOUNTER — Encounter: Payer: Self-pay | Admitting: Family

## 2023-09-04 ENCOUNTER — Other Ambulatory Visit: Payer: Self-pay | Admitting: Family

## 2023-09-04 DIAGNOSIS — M79641 Pain in right hand: Secondary | ICD-10-CM

## 2023-09-09 ENCOUNTER — Other Ambulatory Visit: Payer: Self-pay | Admitting: Family

## 2023-09-09 ENCOUNTER — Other Ambulatory Visit (HOSPITAL_COMMUNITY): Payer: Self-pay

## 2023-09-09 ENCOUNTER — Telehealth: Payer: Self-pay

## 2023-09-09 NOTE — Telephone Encounter (Signed)
 Pharmacy Patient Advocate Encounter   Received notification from Patient Advice Request messages that prior authorization for Wegovy  2.4MG /0.75ML auto-injectors is required/requested.   Insurance verification completed.   The patient is insured through [facility] .   Per test claim: PA required; PA submitted to above mentioned insurance via CoverMyMeds Key/confirmation #/EOC [id number] Status is pending

## 2023-09-09 NOTE — Telephone Encounter (Signed)
 Pharmacy Patient Advocate Encounter  Received notification from [facility] that Prior Authorization for Wegovy  2.4MG /0.75ML auto-injectors  has been DENIED.  Full denial letter will be uploaded to the media tab. See denial reason below.   PA #/Case ID/Reference #: [id number]    Denied. This health benefit plan does not cover the following services, supplies, drugs or charges: Any treatment or regimen, medical or surgical, for the purpose of reducing or controlling the weight of the member, or for the treatment of obesity, except for surgical treatment of morbid obesity, or as specifically covered by this health benefit plan.

## 2023-09-10 ENCOUNTER — Other Ambulatory Visit: Payer: Self-pay

## 2023-09-10 ENCOUNTER — Other Ambulatory Visit: Payer: Self-pay | Admitting: Family

## 2023-09-10 DIAGNOSIS — G4733 Obstructive sleep apnea (adult) (pediatric): Secondary | ICD-10-CM

## 2023-09-10 MED ORDER — WEGOVY 2.4 MG/0.75ML ~~LOC~~ SOAJ: SUBCUTANEOUS | 0 refills | Status: DC

## 2023-09-10 MED ORDER — ZEPBOUND 2.5 MG/0.5ML ~~LOC~~ SOAJ: 2.5000 mg | SUBCUTANEOUS | 1 refills | Status: DC

## 2023-09-10 NOTE — Telephone Encounter (Signed)
 Pt has been notified and will read all info attached below , we discussed the basic and most important details . Pt will let us  know how well he is tolerating the Zepbound , and if after the initial 4 weeks if he would like  to increase. He will let us  know

## 2023-09-11 ENCOUNTER — Other Ambulatory Visit: Payer: Self-pay | Admitting: Family

## 2023-09-11 DIAGNOSIS — G4733 Obstructive sleep apnea (adult) (pediatric): Secondary | ICD-10-CM

## 2023-09-18 ENCOUNTER — Other Ambulatory Visit (HOSPITAL_COMMUNITY): Payer: Self-pay

## 2023-09-18 ENCOUNTER — Telehealth: Payer: Self-pay

## 2023-09-18 NOTE — Telephone Encounter (Signed)
 Pharmacy Patient Advocate Encounter  Received notification from [facility] that Prior Authorization for Zepbound  2.5MG /0.5ML pen-injectors  has been DENIED.  Full denial letter will be uploaded to the media tab. See denial reason below.   PA #/Case ID/Reference #: [id number]    Denied. This health benefit plan does not cover the following services, supplies, drugs or charges: Any treatment or regimen, medical or surgical, for the purpose of reducing or controlling the weight of the member, or for the treatment of obesity, except for surgical treatment of morbid obesity, or as specifically covered by this health benefit plan.

## 2023-09-18 NOTE — Telephone Encounter (Signed)
 Pharmacy Patient Advocate Encounter   Received notification from Patient Advice Request messages that prior authorization for Zepbound  2.5MG /0.5ML pen-injectors is required/requested.   Insurance verification completed.   The patient is insured through [facility] .   Per test claim: PA required; PA submitted to above mentioned insurance via CoverMyMeds Key/confirmation #/EOC [facility] Status is pending

## 2023-09-26 NOTE — Telephone Encounter (Signed)
 Noted. Telephone encounter and MyChart was sent to notify provider and patient.

## 2023-10-16 ENCOUNTER — Encounter: Payer: Self-pay | Admitting: Family

## 2023-10-22 ENCOUNTER — Telehealth: Payer: Self-pay

## 2023-10-22 NOTE — Telephone Encounter (Signed)
 Spoke to pt to make sure we have correct info to fill out FMLA parework

## 2023-10-22 NOTE — Telephone Encounter (Signed)
 Copied from CRM [id number]. Topic: General - Other >> Oct 22, 2023  2:43 PM Thersia BROCKS wrote: Reason for CRM: Patient called in wanting to know if NP Rollene Northern received his FMLA paperwork would like a callback regarding this

## 2023-11-12 NOTE — Telephone Encounter (Signed)
 Spoke to pt we will discuss during visit tomorrow on 11/13/23

## 2023-11-13 ENCOUNTER — Ambulatory Visit: Admitting: Family

## 2023-11-13 ENCOUNTER — Encounter: Payer: Self-pay | Admitting: Family

## 2023-11-13 DIAGNOSIS — Z23 Encounter for immunization: Secondary | ICD-10-CM | POA: Diagnosis not present

## 2023-11-13 DIAGNOSIS — M79641 Pain in right hand: Secondary | ICD-10-CM

## 2023-11-13 DIAGNOSIS — G4733 Obstructive sleep apnea (adult) (pediatric): Secondary | ICD-10-CM | POA: Diagnosis not present

## 2023-11-13 MED ORDER — METFORMIN HCL ER 500 MG PO TB24: 500.0000 mg | ORAL_TABLET | Freq: Every evening | ORAL | 2 refills | Status: AC

## 2023-11-13 NOTE — Patient Instructions (Addendum)
 It is very important to arrange second opinion with Dr Salina neurosurgery. Let me know if any issues is doing so.   Please also call [employer] and discuss history of MODERATE sleep apnea. Will Zepbound  be approved in the near future for use?  Metformin  is used in prediabetes, diabetes, and also for weight loss by decreasing calorie consumption.   It works in a couple of ways by decreasing liver glucose production, decreases intestinal absorption of glucose and improves insulin  sensitivity (increases peripheral glucose uptake and utilization).     Please make sure that you titrate per below so not to cause any GI upset.    Start metformin  XR with one 500mg  tablet at night. After one week, you may increase to two tablets at night ( total of 1000mg ) . The third week, you may take take two tablets at night and one tablet in the morning.  The fourth week, you may take two tablets in the morning ( 1000mg  total) and two tablets at night (1000mg  total). This will bring you to a maximum daily dose of 2000mg /day which is maximum dose.  So you are aware,  you may take ALL 4 tablets of metformin  together at the same time if preferable and doesn't cause GI upset. You may take metformin  2000mg  ( four of the 500mg  tablets) together in the morning or at night if you prefer.   Along the way, if you want to increase more slowly, please do as this medication can cause GI discomfort and loose stools which usually get better with time , however some patients find that they can only tolerate a certain dose and cannot increase to maximum dose.

## 2023-11-13 NOTE — Assessment & Plan Note (Signed)
 Chronic, concern for lack of treatment.  Asked patient to call insurance company  to see if Zepbound  be approved in the near term as we approach the new year.

## 2023-11-13 NOTE — Assessment & Plan Note (Signed)
 Concern for progression, poor control.  Discussed concern for permanent, irreversible nerve damage.  Patient declines retrial of gabapentin  or Cymbalta .  He declines repeat imaging as prefers to have consult with neurosurgery. Previous referral to neurosurgery placed.  Patient will call to schedule this. Will complete FMLA paperwork as requested

## 2023-11-13 NOTE — Progress Notes (Signed)
 Assessment & Plan:  Morbid obesity (HCC) Assessment & Plan: Retrial metformin .  Counseled on side effects, titration.  Orders: -     Insulin , random; Future -     metFORMIN  HCl ER; Take 1 tablet (500 mg total) by mouth every evening.  Dispense: 90 tablet; Refill: 2  Need for influenza vaccination -     Flu vaccine trivalent PF, 6mos and older(Flulaval,Afluria,Fluarix,Fluzone)  OSA (obstructive sleep apnea) Assessment & Plan: Chronic, concern for lack of treatment.  Asked patient to call insurance company  to see if Zepbound  be approved in the near term as we approach the new year.   Right hand pain Assessment & Plan: Concern for progression, poor control.  Discussed concern for permanent, irreversible nerve damage.  Patient declines retrial of gabapentin  or Cymbalta .  He declines repeat imaging as prefers to have consult with neurosurgery. Previous referral to neurosurgery placed.  Patient will call to schedule this. Will complete FMLA paperwork as requested      Return precautions given.   Risks, benefits, and alternatives of the medications and treatment plan prescribed today were discussed, and patient expressed understanding.   Education regarding symptom management and diagnosis given to patient on AVS either electronically or printed.  No follow-ups on file.  Rollene Northern, FNP  Subjective:    Patient ID: Yesenia Fontenette, male    DOB: 09/18/1970, 53 y.o.   MRN: [id number]  CC: Sharod Petsch is a 53 y.o. male who presents today for follow up.   HPI: He has gained weight since coming off the wegovy .  He has tried metformin  in the past however didn't recall losing weight.      Insurance has denied wegovy  and zepbound . Health plan only covers surgery per denial letter  He had moderate OSA; dx 08/17/18 He complains complains of episodic right posterior neck and right wrist numbness, pain x 2- years, worsening.  Bothers him 4-5 times per month. Worse with  transporting patient.  Describes as throbbing.  Denies HA, fever, chills, rash.   He is requesting FLMA due to right arm and hand pain. Previously seen by Dr Dow.  He didn't see Dr Salina; he made the appointment with Dr Salina, he plans to schedule.    Allergies: Patient has no known allergies. No current outpatient medications on file prior to visit.   No current facility-administered medications on file prior to visit.    Review of Systems  Constitutional:  Negative for chills and fever.  Respiratory:  Negative for cough.   Cardiovascular:  Negative for chest pain and palpitations.  Gastrointestinal:  Negative for nausea and vomiting.  Musculoskeletal:  Positive for neck pain.  Neurological:  Positive for numbness. Negative for headaches.      Objective:    BP 132/78   Pulse 70   Temp 97.9 F (36.6 C) (Oral)   Ht 6' 4 (1.93 m)   Wt 299 lb 6.4 oz (135.8 kg)   SpO2 96%   BMI 36.44 kg/m  BP Readings from Last 3 Encounters:  11/13/23 132/78  08/21/23 124/70  04/18/23 111/73   Wt Readings from Last 3 Encounters:  11/13/23 299 lb 6.4 oz (135.8 kg)  08/21/23 279 lb (126.6 kg)  02/13/23 283 lb 3.2 oz (128.5 kg)    Physical Exam Vitals reviewed.  Constitutional:      Appearance: He is well-developed.  Cardiovascular:     Rate and Rhythm: Regular rhythm.     Heart sounds: Normal heart sounds.  Pulmonary:  Effort: Pulmonary effort is normal. No respiratory distress.     Breath sounds: Normal breath sounds. No wheezing, rhonchi or rales.  Musculoskeletal:     Right shoulder: No swelling.     Left shoulder: No swelling.       Hands:     Cervical back: Normal range of motion. No torticollis. No pain with movement, spinous process tenderness or muscular tenderness.     Comments: Numbness peripherally 2nd and 3rd metacarpal Grip strength normal, symmetric 5/5 BUE  Bilateral Shoulder:   No asymmetry of shoulders when comparing right and left.No pain with  palpation over glenohumeral joint lines, Sterling joint, AC joint, or bicipital groove. No pain with internal and external rotation. No pain with resisted lateral extension .   Negative active painful arc sign. Negative passive arc ( Neer's). Negative drop arm.       Skin:    General: Skin is warm and dry.  Neurological:     Mental Status: He is alert.  Psychiatric:        Speech: Speech normal.        Behavior: Behavior normal.

## 2023-11-13 NOTE — Assessment & Plan Note (Signed)
 Retrial metformin .  Counseled on side effects, titration.

## 2023-11-14 ENCOUNTER — Ambulatory Visit: Payer: Self-pay | Admitting: Family

## 2023-11-14 LAB — INSULIN, RANDOM: Insulin: 4.4 u[IU]/mL

## 2023-11-20 ENCOUNTER — Telehealth: Payer: Self-pay | Admitting: Family

## 2023-11-20 ENCOUNTER — Encounter: Payer: Self-pay | Admitting: Family

## 2023-11-20 NOTE — Telephone Encounter (Signed)
Placed in red folder for completion.  

## 2023-11-20 NOTE — Telephone Encounter (Signed)
 [email or web address] came into office and drop off forms for arnett.

## 2023-11-22 NOTE — Telephone Encounter (Signed)
 Spoke to pt faxed forms back to proper destination and sent paperwork to scan as well

## 2023-11-22 NOTE — Telephone Encounter (Signed)
 Faxed to [facility] on 11/22/23 pt has been notified and made a copy and paperwork has been sent to scan

## 2023-11-22 NOTE — Telephone Encounter (Signed)
 Completed Given to jenate

## 2023-11-28 ENCOUNTER — Encounter: Payer: Self-pay | Admitting: Family

## 2024-01-02 ENCOUNTER — Other Ambulatory Visit (HOSPITAL_COMMUNITY): Payer: Self-pay

## 2024-01-03 ENCOUNTER — Other Ambulatory Visit (HOSPITAL_COMMUNITY): Payer: Self-pay

## 2024-01-10 ENCOUNTER — Other Ambulatory Visit (HOSPITAL_COMMUNITY): Payer: Self-pay

## 2024-01-15 NOTE — Telephone Encounter (Signed)
 open in error
